# Patient Record
Sex: Male | Born: 1955 | Race: Black or African American | Hispanic: No | Marital: Married | Smoking: Never smoker
Health system: Southern US, Community
[De-identification: ages and names within clinical notes are randomized; demographics above are authoritative.]

## PROBLEM LIST (undated history)

## (undated) DIAGNOSIS — N400 Enlarged prostate without lower urinary tract symptoms: Secondary | ICD-10-CM

## (undated) DIAGNOSIS — E785 Hyperlipidemia, unspecified: Secondary | ICD-10-CM

## (undated) DIAGNOSIS — E119 Type 2 diabetes mellitus without complications: Secondary | ICD-10-CM

## (undated) DIAGNOSIS — I10 Essential (primary) hypertension: Secondary | ICD-10-CM

---

## 1998-12-12 ENCOUNTER — Emergency Department (HOSPITAL_COMMUNITY): Admission: EM | Admit: 1998-12-12 | Discharge: 1998-12-12 | Payer: Self-pay | Admitting: Emergency Medicine

## 1998-12-12 ENCOUNTER — Encounter: Payer: Self-pay | Admitting: Emergency Medicine

## 1999-04-14 ENCOUNTER — Emergency Department (HOSPITAL_COMMUNITY): Admission: EM | Admit: 1999-04-14 | Discharge: 1999-04-14 | Payer: Self-pay | Admitting: Emergency Medicine

## 1999-04-17 ENCOUNTER — Ambulatory Visit (HOSPITAL_COMMUNITY): Admission: RE | Admit: 1999-04-17 | Discharge: 1999-04-17 | Payer: Self-pay | Admitting: Cardiology

## 1999-04-17 ENCOUNTER — Encounter: Payer: Self-pay | Admitting: Cardiology

## 2000-04-17 ENCOUNTER — Emergency Department (HOSPITAL_COMMUNITY): Admission: EM | Admit: 2000-04-17 | Discharge: 2000-04-17 | Payer: Self-pay | Admitting: *Deleted

## 2000-04-27 ENCOUNTER — Emergency Department (HOSPITAL_COMMUNITY): Admission: EM | Admit: 2000-04-27 | Discharge: 2000-04-27 | Payer: Self-pay | Admitting: Emergency Medicine

## 2000-04-28 ENCOUNTER — Encounter: Admission: RE | Admit: 2000-04-28 | Discharge: 2000-04-28 | Payer: Self-pay | Admitting: Cardiovascular Disease

## 2000-04-28 ENCOUNTER — Encounter: Payer: Self-pay | Admitting: Cardiovascular Disease

## 2000-04-29 ENCOUNTER — Encounter: Payer: Self-pay | Admitting: Cardiology

## 2000-04-29 ENCOUNTER — Ambulatory Visit (HOSPITAL_COMMUNITY): Admission: RE | Admit: 2000-04-29 | Discharge: 2000-04-29 | Payer: Self-pay | Admitting: Cardiology

## 2014-11-02 ENCOUNTER — Emergency Department (HOSPITAL_COMMUNITY)
Admission: EM | Admit: 2014-11-02 | Discharge: 2014-11-03 | Disposition: A | Discharge status: Home / Self Care | Payer: 59 | Attending: Emergency Medicine | Admitting: Emergency Medicine

## 2014-11-02 ENCOUNTER — Encounter (HOSPITAL_COMMUNITY): Payer: Self-pay | Admitting: *Deleted

## 2014-11-02 DIAGNOSIS — Z79899 Other long term (current) drug therapy: Secondary | ICD-10-CM | POA: Insufficient documentation

## 2014-11-02 DIAGNOSIS — G44209 Tension-type headache, unspecified, not intractable: Secondary | ICD-10-CM

## 2014-11-02 DIAGNOSIS — E119 Type 2 diabetes mellitus without complications: Secondary | ICD-10-CM | POA: Diagnosis not present

## 2014-11-02 DIAGNOSIS — I10 Essential (primary) hypertension: Secondary | ICD-10-CM | POA: Insufficient documentation

## 2014-11-02 DIAGNOSIS — R51 Headache: Secondary | ICD-10-CM | POA: Insufficient documentation

## 2014-11-02 HISTORY — DX: Essential (primary) hypertension: I10

## 2014-11-02 HISTORY — DX: Type 2 diabetes mellitus without complications: E11.9

## 2014-11-02 LAB — CBC WITH DIFFERENTIAL/PLATELET
Basophils Absolute: 0 10*3/uL (ref 0.0–0.1)
Basophils Relative: 1 % (ref 0–1)
EOS PCT: 1 % (ref 0–5)
Eosinophils Absolute: 0.1 10*3/uL (ref 0.0–0.7)
HEMATOCRIT: 39.4 % (ref 39.0–52.0)
Hemoglobin: 13.3 g/dL (ref 13.0–17.0)
Lymphocytes Relative: 42 % (ref 12–46)
Lymphs Abs: 2.4 10*3/uL (ref 0.7–4.0)
MCH: 30 pg (ref 26.0–34.0)
MCHC: 33.8 g/dL (ref 30.0–36.0)
MCV: 88.7 fL (ref 78.0–100.0)
MONO ABS: 0.5 10*3/uL (ref 0.1–1.0)
Monocytes Relative: 8 % (ref 3–12)
Neutro Abs: 2.7 10*3/uL (ref 1.7–7.7)
Neutrophils Relative %: 48 % (ref 43–77)
PLATELETS: 244 10*3/uL (ref 150–400)
RBC: 4.44 MIL/uL (ref 4.22–5.81)
RDW: 13.7 % (ref 11.5–15.5)
WBC: 5.7 10*3/uL (ref 4.0–10.5)

## 2014-11-02 LAB — COMPREHENSIVE METABOLIC PANEL
ALT: 12 U/L (ref 0–53)
AST: 16 U/L (ref 0–37)
Albumin: 3.7 g/dL (ref 3.5–5.2)
Alkaline Phosphatase: 78 U/L (ref 39–117)
Anion gap: 10 (ref 5–15)
BILIRUBIN TOTAL: 0.2 mg/dL — AB (ref 0.3–1.2)
BUN: 14 mg/dL (ref 6–23)
CHLORIDE: 102 mmol/L (ref 96–112)
CO2: 27 mmol/L (ref 19–32)
CREATININE: 1.32 mg/dL (ref 0.50–1.35)
Calcium: 9.7 mg/dL (ref 8.4–10.5)
GFR calc Af Amer: 66 mL/min — ABNORMAL LOW (ref >90)
GFR calc non Af Amer: 57 mL/min — ABNORMAL LOW (ref >90)
Glucose, Bld: 148 mg/dL — ABNORMAL HIGH (ref 70–99)
Potassium: 3.4 mmol/L — ABNORMAL LOW (ref 3.5–5.1)
Sodium: 139 mmol/L (ref 135–145)
Total Protein: 6.7 g/dL (ref 6.0–8.3)

## 2014-11-02 LAB — CBG MONITORING, ED: GLUCOSE-CAPILLARY: 219 mg/dL — AB (ref 70–99)

## 2014-11-02 MED ORDER — SODIUM CHLORIDE 0.9 % IV SOLN
1000.0000 mL | Freq: Once | INTRAVENOUS | Status: AC
  Administered 2014-11-02: 1000 mL via INTRAVENOUS

## 2014-11-02 MED ORDER — METOCLOPRAMIDE HCL 5 MG/ML IJ SOLN
10.0000 mg | Freq: Once | INTRAMUSCULAR | Status: AC
  Administered 2014-11-02: 10 mg via INTRAVENOUS
  Filled 2014-11-02: qty 2

## 2014-11-02 MED ORDER — POTASSIUM CHLORIDE CRYS ER 20 MEQ PO TBCR
40.0000 meq | EXTENDED_RELEASE_TABLET | Freq: Once | ORAL | Status: AC
  Administered 2014-11-02: 40 meq via ORAL
  Filled 2014-11-02: qty 2

## 2014-11-02 MED ORDER — DIPHENHYDRAMINE HCL 50 MG/ML IJ SOLN
25.0000 mg | Freq: Once | INTRAMUSCULAR | Status: AC
  Administered 2014-11-02: 25 mg via INTRAVENOUS
  Filled 2014-11-02: qty 1

## 2014-11-02 MED ORDER — SODIUM CHLORIDE 0.9 % IV SOLN: 1000.0000 mL | INTRAVENOUS | Status: DC

## 2014-11-02 NOTE — ED Notes (Signed)
Pts CBG 216. Pts wife approached nurse first requesting a wheelchair [place] [facility] pt was feeling weak. Pt brought back to triage CBG done and vitals rechecked. Nurse first made aware.

## 2014-11-02 NOTE — ED Provider Notes (Signed)
CSN: [id number]     Arrival date & time 11/02/14  1856 History   First MD Initiated Contact with Patient 11/02/14 2150     Chief Complaint  Patient presents with  . Hypertension     (Consider location/radiation/quality/duration/timing/severity/associated sxs/prior Treatment) Patient is a 59 y.o. male presenting with hypertension. The history is provided by the patient.  Hypertension  He developed a bifrontal headache this afternoon. Headache was throbbing in nature and he rated it at 9/10. There is no visual change or photophobia, but there was nausea. Headache was worse when he was exposed to loud noise. Because of the headache, he checked his blood pressure at home and was 210/110 taking to the ED. He actually did check his blood pressure twice before coming. He is prescribed medication for his blood pressure but admits that he does miss doses on occasion. He does not check his blood pressure on a regular basis. He denies epistaxis or tinnitus.  Past Medical History  Diagnosis Date  . Hypertension   . Diabetes mellitus without complication    History reviewed. No pertinent past surgical history. No family history on file. History  Substance Use Topics  . Smoking status: Never Smoker   . Smokeless tobacco: Not on file  . Alcohol Use: No    Review of Systems  All other systems reviewed and are negative.     Allergies  Review of patient's allergies indicates no known allergies.  Home Medications   Prior to Admission medications   Medication Sig Start Date End Date Taking? Authorizing Provider  Cholecalciferol (VITAMIN D PO) Take 1 tablet by mouth 3 (three) times daily.   Yes Historical Provider, MD  metFORMIN (GLUCOPHAGE) 1000 MG tablet Take 1,000 mg by mouth daily.   Yes Historical Provider, MD  Multiple Vitamins-Minerals (ZINC PO) Take 1 tablet by mouth 3 (three) times daily.   Yes Historical Provider, MD  olmesartan (BENICAR) 40 MG tablet Take 40 mg by mouth daily.   Yes  Historical Provider, MD  rosuvastatin (CRESTOR) 10 MG tablet Take 10 mg by mouth daily.   Yes Historical Provider, MD   BP 182/90 mmHg  Pulse 60  Temp(Src) 98.3 F (36.8 C)  Resp 18  Wt 187 lb 7 oz (85.021 kg)  SpO2 98% Physical Exam  Nursing note and vitals reviewed.  59 year old male, resting comfortably and in no acute distress. Vital signs are significant for hypertension. Oxygen saturation is 98%, which is normal. Head is normocephalic and atraumatic. PERRLA, EOMI. Oropharynx is clear. Fundi show no hemorrhage, exudate, or papilledema. There is tenderness to palpation over the temporalis muscles bilaterally. There is no tenderness over the temporal arteries. Neck is nontender and supple without adenopathy or JVD. Back is nontender and there is no CVA tenderness. Lungs are clear without rales, wheezes, or rhonchi. Chest is nontender. Heart has regular rate and rhythm without murmur. Abdomen is soft, flat, nontender without masses or hepatosplenomegaly and peristalsis is normoactive. Extremities have no cyanosis or edema, full range of motion is present. Skin is warm and dry without rash. Neurologic: Mental status is normal, cranial nerves are intact, there are no motor or sensory deficits.  ED Course  Procedures (including critical care time) Labs Review Results for orders placed or performed during the hospital encounter of 11/02/14  CBC with Differential  Result Value Ref Range   WBC 5.7 4.0 - 10.5 K/uL   RBC 4.44 4.22 - 5.81 MIL/uL   Hemoglobin 13.3 13.0 - 17.0 g/dL  HCT 39.4 39.0 - 52.0 %   MCV 88.7 78.0 - 100.0 fL   MCH 30.0 26.0 - 34.0 pg   MCHC 33.8 30.0 - 36.0 g/dL   RDW 13.7 11.5 - 15.5 %   Platelets 244 150 - 400 K/uL   Neutrophils Relative % 48 43 - 77 %   Neutro Abs 2.7 1.7 - 7.7 K/uL   Lymphocytes Relative 42 12 - 46 %   Lymphs Abs 2.4 0.7 - 4.0 K/uL   Monocytes Relative 8 3 - 12 %   Monocytes Absolute 0.5 0.1 - 1.0 K/uL   Eosinophils Relative 1 0 - 5 %    Eosinophils Absolute 0.1 0.0 - 0.7 K/uL   Basophils Relative 1 0 - 1 %   Basophils Absolute 0.0 0.0 - 0.1 K/uL  Comprehensive metabolic panel  Result Value Ref Range   Sodium 139 135 - 145 mmol/L   Potassium 3.4 (L) 3.5 - 5.1 mmol/L   Chloride 102 96 - 112 mmol/L   CO2 27 19 - 32 mmol/L   Glucose, Bld 148 (H) 70 - 99 mg/dL   BUN 14 6 - 23 mg/dL   Creatinine, Ser 1.32 0.50 - 1.35 mg/dL   Calcium 9.7 8.4 - 10.5 mg/dL   Total Protein 6.7 6.0 - 8.3 g/dL   Albumin 3.7 3.5 - 5.2 g/dL   AST 16 0 - 37 U/L   ALT 12 0 - 53 U/L   Alkaline Phosphatase 78 39 - 117 U/L   Total Bilirubin 0.2 (L) 0.3 - 1.2 mg/dL   GFR calc non Af Amer 57 (L) >90 mL/min   GFR calc Af Amer 66 (L) >90 mL/min   Anion gap 10 5 - 15  POC CBG, ED  Result Value Ref Range   Glucose-Capillary 219 (H) 70 - 99 mg/dL     MDM   Final diagnoses:  Muscle contraction headache  Essential hypertension    Headache with the characteristics suggestive of muscle contraction headache. Hypertension which is inadequately controlled. He will be given treatment for his headache with IV fluids, metoclopramide, and diphenhydramine and blood pressure rechecked once headache has resolved. Prior records are reviewed and he has no recent visits in the [facility] system.  He had excellent relief of his headache with above noted treatment. With this, blood pressure has come down to 156/73. He is discharged with instructions to monitor his blood pressure at home and to keep a log to take with him when he sees his PCP. No indication for adjusting his antihypertensive regimen today.  Delora Fuel, MD [id number]

## 2014-11-02 NOTE — ED Notes (Signed)
The pt is c/o high bp today and he has had aheadache for several days.  He takes metformin for diabetes and he has been loosing weight since he started taking it

## 2014-11-02 NOTE — ED Notes (Signed)
Ambulatory to restroom

## 2014-11-02 NOTE — ED Notes (Signed)
Pt reports he is hungry and [place] [employer] [place]. Pt observed [place] [facility] with steady gait, NAD. Pt reports he will be back to waiting room to be seen by MD when his name is called.

## 2014-11-03 NOTE — Discharge Instructions (Signed)
Monitor your blood pressure at home and keep a log of it. The spring this log with you when you see your primary care provider.  General Headache Without Cause A headache is pain or discomfort felt around the head or neck area. The specific cause of a headache may not be found. There are many causes and types of headaches. A few common ones are:  Tension headaches.  Migraine headaches.  Cluster headaches.  Chronic daily headaches. HOME CARE INSTRUCTIONS   Keep all follow-up appointments with your caregiver or any specialist referral.  Only take over-the-counter or prescription medicines for pain or discomfort as directed by your caregiver.  Lie down in a dark, quiet room when you have a headache.  Keep a headache journal to find out what may trigger your migraine headaches. For example, write down:  What you eat and drink.  How much sleep you get.  Any change to your diet or medicines.  Try massage or other relaxation techniques.  Put ice packs or heat on the head and neck. Use these 3 to 4 times per day for 15 to 20 minutes each time, or as needed.  Limit stress.  Sit up straight, and do not tense your muscles.  Quit smoking if you smoke.  Limit alcohol use.  Decrease the amount of caffeine you drink, or stop drinking caffeine.  Eat and sleep on a regular schedule.  Get 7 to 9 hours of sleep, or as recommended by your caregiver.  Keep lights dim if bright lights bother you and make your headaches worse. SEEK MEDICAL CARE IF:   You have problems with the medicines you were prescribed.  Your medicines are not working.  You have a change from the usual headache.  You have nausea or vomiting. SEEK IMMEDIATE MEDICAL CARE IF:   Your headache becomes severe.  You have a fever.  You have a stiff neck.  You have loss of vision.  You have muscular weakness or loss of muscle control.  You start losing your balance or have trouble walking.  You feel faint or  pass out.  You have severe symptoms that are different from your first symptoms. MAKE SURE YOU:   Understand these instructions.  Will watch your condition.  Will get help right away if you are not doing well or get worse. Document Released: 07/20/2005 Document Revised: 10/12/2011 Document Reviewed: 08/05/2011 [facility] Patient Information 2015 [place], [place]. This information is not intended to replace advice given to you by your health care provider. Make sure you discuss any questions you have with your health care provider.

## 2014-11-06 ENCOUNTER — Encounter (HOSPITAL_COMMUNITY): Payer: Self-pay | Admitting: Emergency Medicine

## 2014-11-06 ENCOUNTER — Emergency Department (HOSPITAL_COMMUNITY)
Admission: EM | Admit: 2014-11-06 | Discharge: 2014-11-06 | Discharge status: Against Medical Advice | Payer: 59 | Attending: Emergency Medicine | Admitting: Emergency Medicine

## 2014-11-06 DIAGNOSIS — E119 Type 2 diabetes mellitus without complications: Secondary | ICD-10-CM | POA: Diagnosis not present

## 2014-11-06 DIAGNOSIS — I10 Essential (primary) hypertension: Secondary | ICD-10-CM | POA: Insufficient documentation

## 2014-11-06 NOTE — ED Notes (Signed)
The patient has been having Hypertension since friday. HIs wife has been keeping track of the pressure for the last couple of days.  He did say he had a slight headache but it resolved.  The patient was seen here last Friday and was given IV meds and he was sent home.  He is back for the same thing.

## 2014-11-06 NOTE — ED Notes (Signed)
Patient advised registration he was LWBS at 0310hrs

## 2016-03-21 ENCOUNTER — Emergency Department (HOSPITAL_COMMUNITY): Payer: Self-pay

## 2016-03-21 ENCOUNTER — Observation Stay (HOSPITAL_COMMUNITY)
Admission: EM | Admit: 2016-03-21 | Discharge: 2016-03-24 | Disposition: A | Discharge status: Home / Self Care | Payer: Self-pay | Attending: Internal Medicine | Admitting: Internal Medicine

## 2016-03-21 ENCOUNTER — Encounter (HOSPITAL_COMMUNITY): Payer: Self-pay | Admitting: Emergency Medicine

## 2016-03-21 DIAGNOSIS — I2510 Atherosclerotic heart disease of native coronary artery without angina pectoris: Secondary | ICD-10-CM | POA: Insufficient documentation

## 2016-03-21 DIAGNOSIS — I10 Essential (primary) hypertension: Secondary | ICD-10-CM | POA: Diagnosis present

## 2016-03-21 DIAGNOSIS — N400 Enlarged prostate without lower urinary tract symptoms: Secondary | ICD-10-CM | POA: Diagnosis present

## 2016-03-21 DIAGNOSIS — R0789 Other chest pain: Principal | ICD-10-CM | POA: Insufficient documentation

## 2016-03-21 DIAGNOSIS — I129 Hypertensive chronic kidney disease with stage 1 through stage 4 chronic kidney disease, or unspecified chronic kidney disease: Secondary | ICD-10-CM | POA: Insufficient documentation

## 2016-03-21 DIAGNOSIS — E8881 Metabolic syndrome: Secondary | ICD-10-CM | POA: Insufficient documentation

## 2016-03-21 DIAGNOSIS — N179 Acute kidney failure, unspecified: Secondary | ICD-10-CM | POA: Diagnosis present

## 2016-03-21 DIAGNOSIS — E781 Pure hyperglyceridemia: Secondary | ICD-10-CM | POA: Insufficient documentation

## 2016-03-21 DIAGNOSIS — Z566 Other physical and mental strain related to work: Secondary | ICD-10-CM

## 2016-03-21 DIAGNOSIS — Z7982 Long term (current) use of aspirin: Secondary | ICD-10-CM | POA: Insufficient documentation

## 2016-03-21 DIAGNOSIS — R079 Chest pain, unspecified: Secondary | ICD-10-CM | POA: Diagnosis present

## 2016-03-21 DIAGNOSIS — Z7984 Long term (current) use of oral hypoglycemic drugs: Secondary | ICD-10-CM | POA: Insufficient documentation

## 2016-03-21 DIAGNOSIS — E1165 Type 2 diabetes mellitus with hyperglycemia: Secondary | ICD-10-CM | POA: Insufficient documentation

## 2016-03-21 DIAGNOSIS — E1129 Type 2 diabetes mellitus with other diabetic kidney complication: Secondary | ICD-10-CM

## 2016-03-21 DIAGNOSIS — N183 Chronic kidney disease, stage 3 unspecified: Secondary | ICD-10-CM | POA: Diagnosis present

## 2016-03-21 DIAGNOSIS — R9439 Abnormal result of other cardiovascular function study: Secondary | ICD-10-CM

## 2016-03-21 DIAGNOSIS — E1122 Type 2 diabetes mellitus with diabetic chronic kidney disease: Secondary | ICD-10-CM | POA: Insufficient documentation

## 2016-03-21 DIAGNOSIS — E1100 Type 2 diabetes mellitus with hyperosmolarity without nonketotic hyperglycemic-hyperosmolar coma (NKHHC): Secondary | ICD-10-CM

## 2016-03-21 DIAGNOSIS — E785 Hyperlipidemia, unspecified: Secondary | ICD-10-CM | POA: Diagnosis present

## 2016-03-21 HISTORY — DX: Hyperlipidemia, unspecified: E78.5

## 2016-03-21 HISTORY — DX: Benign prostatic hyperplasia without lower urinary tract symptoms: N40.0

## 2016-03-21 LAB — BASIC METABOLIC PANEL
Anion gap: 8 (ref 5–15)
BUN: 20 mg/dL (ref 6–20)
CHLORIDE: 98 mmol/L — AB (ref 101–111)
CO2: 26 mmol/L (ref 22–32)
CREATININE: 2.08 mg/dL — AB (ref 0.61–1.24)
Calcium: 9.3 mg/dL (ref 8.9–10.3)
GFR, EST AFRICAN AMERICAN: 38 mL/min — AB (ref >60)
GFR, EST NON AFRICAN AMERICAN: 33 mL/min — AB (ref >60)
Glucose, Bld: 363 mg/dL — ABNORMAL HIGH (ref 65–99)
Potassium: 3.6 mmol/L (ref 3.5–5.1)
SODIUM: 132 mmol/L — AB (ref 135–145)

## 2016-03-21 LAB — CBC
HCT: 40.3 % (ref 39.0–52.0)
Hemoglobin: 13.7 g/dL (ref 13.0–17.0)
MCH: 31.2 pg (ref 26.0–34.0)
MCHC: 34 g/dL (ref 30.0–36.0)
MCV: 91.8 fL (ref 78.0–100.0)
PLATELETS: 268 10*3/uL (ref 150–400)
RBC: 4.39 MIL/uL (ref 4.22–5.81)
RDW: 12.3 % (ref 11.5–15.5)
WBC: 5.9 10*3/uL (ref 4.0–10.5)

## 2016-03-21 LAB — I-STAT TROPONIN, ED: Troponin i, poc: 0.01 ng/mL (ref 0.00–0.08)

## 2016-03-21 LAB — URINE MICROSCOPIC-ADD ON
RBC / HPF: NONE SEEN RBC/hpf (ref 0–5)
WBC UA: NONE SEEN WBC/hpf (ref 0–5)

## 2016-03-21 LAB — URINALYSIS, ROUTINE W REFLEX MICROSCOPIC
BILIRUBIN URINE: NEGATIVE
Glucose, UA: >1000 mg/dL — AB
Hgb urine dipstick: NEGATIVE
Ketones, ur: NEGATIVE mg/dL
Leukocytes, UA: NEGATIVE
Nitrite: NEGATIVE
PH: 5.5 (ref 5.0–8.0)
Protein, ur: NEGATIVE mg/dL
Specific Gravity, Urine: 1.026 (ref 1.005–1.030)

## 2016-03-21 LAB — TROPONIN I: Troponin I: <0.03 ng/mL (ref <0.03)

## 2016-03-21 LAB — D-DIMER, QUANTITATIVE (NOT AT ARMC): D-Dimer, Quant: 0.34 ug/mL-FEU (ref 0.00–0.50)

## 2016-03-21 LAB — TSH: TSH: 2.07 u[IU]/mL (ref 0.350–4.500)

## 2016-03-21 MED ORDER — SODIUM CHLORIDE 0.9 % IV BOLUS (SEPSIS)
1000.0000 mL | Freq: Once | INTRAVENOUS | Status: AC
  Administered 2016-03-21: 1000 mL via INTRAVENOUS

## 2016-03-21 MED ORDER — NITROGLYCERIN 0.4 MG SL SUBL
0.4000 mg | SUBLINGUAL_TABLET | SUBLINGUAL | Status: DC | PRN
  Administered 2016-03-21 (×2): 0.4 mg via SUBLINGUAL
  Filled 2016-03-21: qty 1

## 2016-03-21 MED ORDER — ASPIRIN 81 MG PO CHEW
324.0000 mg | CHEWABLE_TABLET | Freq: Once | ORAL | Status: AC
  Administered 2016-03-21: 324 mg via ORAL
  Filled 2016-03-21: qty 4

## 2016-03-21 NOTE — ED Provider Notes (Signed)
[facility] DEPT Provider Note   CSN: [id number] Arrival date & time: 03/21/16  1650     History   Chief Complaint Chief Complaint  Patient presents with  . Dizziness  . Chest Pain    HPI Manuel [name] is a 60 y.o. male.  HPI  Patient with HTN, HLD, DM presents with chest pain.  He mentioned fatigue for a few days.  This morning he woke up with 10/10 crushing chest pain, diaphoresis, SOB.  It has been off/on throughout the day.  The pain is still present.  No recent fevers / chills / other illness.  Past Medical History:  Diagnosis Date  . Diabetes mellitus without complication ([place])   . Hypertension     There are no active problems to display for this patient.   History reviewed. No pertinent surgical history.     Home Medications    Prior to Admission medications   Medication Sig Start Date End Date Taking? Authorizing Provider  Azilsartan-Chlorthalidone (EDARBYCLOR) 40-12.5 MG TABS Take 1 tablet by mouth daily.   Yes Historical Provider, MD  metFORMIN (GLUCOPHAGE) 1000 MG tablet Take 500-1,000 mg by mouth daily.    Yes Historical Provider, MD    Family History No family history on file.  Social History Social History  Substance Use Topics  . Smoking status: Never Smoker  . Smokeless tobacco: Never Used  . Alcohol use No     Allergies   Review of patient's allergies indicates no known allergies.   Review of Systems Review of Systems  Constitutional: Positive for diaphoresis. Negative for chills and fever.  HENT: Negative for ear pain and sore throat.   Eyes: Negative for pain and visual disturbance.  Respiratory: Positive for shortness of breath. Negative for cough.   Cardiovascular: Positive for chest pain. Negative for palpitations.  Gastrointestinal: Negative for abdominal pain and vomiting.  Genitourinary: Negative for dysuria and hematuria.  Musculoskeletal: Negative for arthralgias and back pain.  Skin: Negative for color change and  rash.  Neurological: Negative for seizures and syncope.  All other systems reviewed and are negative.    Physical Exam Updated Vital Signs BP 121/70   Pulse 62   Temp 98.4 F (36.9 C) (Oral)   Resp 19   Ht 5\' 7"  (1.702 m)   Wt 84.8 kg   SpO2 99%   BMI 29.29 kg/m   Physical Exam  Constitutional: He appears well-developed and well-nourished.  HENT:  Head: Normocephalic and atraumatic.  Eyes: Conjunctivae are normal.  Neck: Neck supple.  Cardiovascular: Normal rate and regular rhythm.   No murmur heard. Pulmonary/Chest: Effort normal and breath sounds normal. No respiratory distress.  Abdominal: Soft. There is no tenderness.  Musculoskeletal: He exhibits no edema.  Neurological: He is alert.  Skin: Skin is warm. He is diaphoretic.  Psychiatric: He has a normal mood and affect.  Nursing note and vitals reviewed.    ED Treatments / Results  Labs (all labs ordered are listed, but only abnormal results are displayed) Labs Reviewed  BASIC METABOLIC PANEL - Abnormal; Notable for the following:       Result Value   Sodium 132 (*)    Chloride 98 (*)    Glucose, Bld 363 (*)    Creatinine, Ser 2.08 (*)    GFR calc non Af Amer 33 (*)    GFR calc Af Amer 38 (*)    All other components within normal limits  URINE CULTURE  CBC  TSH  D-DIMER, QUANTITATIVE (  NOT AT [facility])  TROPONIN I  URINALYSIS, ROUTINE W REFLEX MICROSCOPIC (NOT AT [facility])  I-STAT TROPOININ, ED    EKG  EKG Interpretation  Date/Time:  Saturday March 21 2016 16:57:16 EDT Ventricular Rate:  80 PR Interval:  204 QRS Duration: 102 QT Interval:  394 QTC Calculation: [id number] R Axis:   -20 Text Interpretation:  Normal sinus rhythm Nonspecific T wave abnormality Abnormal ECG Abnormal ekg Confirmed by Carmin Muskrat  MD [phone]) on 03/21/2016 9:55:25 PM       Radiology Dg Chest 2 View  Result Date: 03/21/2016 CLINICAL DATA:  Center chest pain, SOB and fatigue x2 days. Hx of HTN, DM, Nonsmoker. EXAM: CHEST  2  VIEW COMPARISON:  None. FINDINGS: Midline trachea. Normal heart size and mediastinal contours. No pleural effusion or pneumothorax. Lower lobe predominant pulmonary interstitial prominence. No lobar consolidation. IMPRESSION: Lower lobe predominant pulmonary interstitial prominence. This is of indeterminate acuity and significance. Although this could relate to chronic bronchitis, in this nonsmoker, viral or atypical bacterial infection cannot be excluded. Comparison with prior radiographs would be useful if available. Electronically Signed   By: Abigail Miyamoto M.D.   On: 03/21/2016 18:06    Procedures Procedures (including critical care time)  Medications Ordered in ED Medications  aspirin chewable tablet 324 mg (not administered)  nitroGLYCERIN (NITROSTAT) SL tablet 0.4 mg (not administered)  sodium chloride 0.9 % bolus 1,000 mL (not administered)     Initial Impression / Assessment and Plan / ED Course  I have reviewed the triage vital signs and the nursing notes.  Pertinent labs & imaging results that were available during my care of the patient were reviewed by me and considered in my medical decision making (see chart for details).  Clinical Course     Patient presents for evaluation of chest pain.  ASA given.  HEART Pathway score is 5.  Troponin negative x1.  EKG is NSR with inverted T waves in inferior leads (no priors to compare to).  Feel like PNA unlikely given CXR without infiltrates or effusion, no leukocytosis, no fever.  No mediastinal widening on CXR.  Doubt emergent intraabdominal pathology given benign abdominal exam and no abdominal symptoms.  Low risk [place] and d-dimer negative.   Labs also reveal increased Cr, possibly intrinsic vs prerenal.  Fluids given.   Patient admitted to hospitalist for ACS ruleout.    Final Clinical Impressions(s) / ED Diagnoses   Final diagnoses:  None    New Prescriptions New Prescriptions   No medications on file  =   Levada Schilling, MD 03/21/16 [id number]    Carmin Muskrat, MD 03/22/16 [phone]

## 2016-03-21 NOTE — ED Notes (Signed)
Pt given [place] sandwich and water, ok per dr Vanita Panda.

## 2016-03-21 NOTE — ED Notes (Signed)
MD at bedside. 

## 2016-03-21 NOTE — H&P (Signed)
History and Physical    Manuel [name] [id number] DOB: 14-Jun-1956 DOA: 03/21/2016  PCP: Maximino Greenland, MD and Marye Round, NP Consultants:  None Patient coming from: home - lives with wife  Chief Complaint: chest pain  HPI: Manuel [name] is a 60 y.o. male with medical history significant of DM, HTN, HLD, and BPH who reports about 0200 he woke up sweating with substernal chest pressure.  Started seeing black and white dots.  Started breathing heavily.  Then felt extreme exhaustion.  Would take 2-3 steps and then felt too tired to go on.  Symptoms lasted off and on all day.  Took Excedrin with a tad bit of relief but chest pressure still present.  Waited until 1600 to come into ER because he was hoping the meds would give relief but he finally accepted that they wouldn't.  Non-exertional.  Not reproducible.  Has not yet received NTG and so doesn't know if this will help.  Pressure is much better than it was earlier, has relieved spotaneously.  Currently 3/10, down from 7/10.      Last stress test (GXT) was many years ago.  No h/o heart cath.  Reports significant work stress.  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   ED Course: Per Dr. Augustin Coupe: Patient presents for evaluation of chest pain.  ASA given.  HEART Pathway score is 5.  Troponin negative x1.  EKG is NSR with inverted T waves in inferior leads (no priors to compare to).  Feel like PNA unlikely given CXR without infiltrates or effusion, no leukocytosis, no fever.  No mediastinal widening on CXR.  Doubt emergent intraabdominal pathology given benign abdominal exam and no abdominal symptoms.  Low risk [place] and d-dimer negative.   Labs also reveal increased Cr, possibly intrinsic vs prerenal.  Fluids given.   Patient admitted to hospitalist for ACS ruleout.  Review of Systems: As per HPI; otherwise 10 point review of systems reviewed and negative.   Ambulatory  Status:  Ambulates independently  Past Medical History:  Diagnosis Date  . BPH (benign prostatic hyperplasia)   . Diabetes mellitus without complication ([place])   . Hyperlipidemia   . Hypertension     History reviewed. No pertinent surgical history.  Social History   Social History  . Marital status: Married    Spouse name: N/A  . Number of children: N/A  . Years of education: N/A   Occupational History  . psychotherapist    Social History Main Topics  . Smoking status: Never Smoker  . Smokeless tobacco: Never Used  . Alcohol use No  . Drug use: No  . Sexual activity: Not on file   Other Topics Concern  . Not on file   Social History Narrative  . No narrative on file    No Known Allergies  Family History  Problem Relation Age of Onset  . Scleroderma Mother 26  . Congestive Heart Failure Father 72    Prior to Admission medications   Medication Sig Start Date End Date Taking? Authorizing Provider  Azilsartan-Chlorthalidone (EDARBYCLOR) 40-12.5 MG TABS Take 1 tablet by mouth daily.   Yes Historical Provider, MD  metFORMIN (GLUCOPHAGE) 1000 MG tablet Take 500-1,000 mg by mouth daily.    Yes Historical Provider, MD    Physical Exam: Vitals:   03/21/16 2145 03/21/16 2315 03/21/16 2330 03/21/16 2345  BP: 133/83 122/69 119/70 109/73  Pulse: 111 (!) 53 66 61  Resp: 17  14 17 23   Temp:      TempSrc:      SpO2: 96% 98% 96% 98%  Weight:      Height:         General: Appears calm and comfortable and is NAD Eyes:  PERRL, EOMI, normal lids, iris ENT:  grossly normal hearing, lips & tongue, mmm Neck:  no LAD, masses or thyromegaly Cardiovascular:  RRR, no m/r/g. No LE edema.  Respiratory:  CTA bilaterally, no w/r/r. Normal respiratory effort. Abdomen:  soft, ntnd, NABS Skin:  no rash or induration seen on limited exam Musculoskeletal:  grossly normal tone BUE/BLE, good ROM, no bony abnormality Psychiatric:  Somewhat anxious affect, speech fluent and appropriate,  AOx3 Neurologic:  CN 2-12 grossly intact, moves all extremities in coordinated fashion, sensation intact  Labs on Admission: I have personally reviewed following labs and imaging studies  CBC:  Recent Labs Lab 03/21/16 1656  WBC 5.9  HGB 13.7  HCT 40.3  MCV 91.8  PLT [id number]   Basic Metabolic Panel:  Recent Labs Lab 03/21/16 1656  NA 132*  K 3.6  CL 98*  CO2 26  GLUCOSE 363*  BUN 20  CREATININE 2.08*  CALCIUM 9.3   GFR: Estimated Creatinine Clearance: 38.8 mL/min (by C-G formula based on SCr of 2.08 mg/dL). Liver Function Tests: No results for input(s): AST, ALT, ALKPHOS, BILITOT, PROT, ALBUMIN in the last 168 hours. No results for input(s): LIPASE, AMYLASE in the last 168 hours. No results for input(s): AMMONIA in the last 168 hours. Coagulation Profile: No results for input(s): INR, PROTIME in the last 168 hours. Cardiac Enzymes:  Recent Labs Lab 03/21/16 2150  TROPONINI <0.03   BNP (last 3 results) No results for input(s): PROBNP in the last 8760 hours. HbA1C: No results for input(s): HGBA1C in the last 72 hours. CBG: No results for input(s): GLUCAP in the last 168 hours. Lipid Profile: No results for input(s): CHOL, HDL, LDLCALC, TRIG, CHOLHDL, LDLDIRECT in the last 72 hours. Thyroid Function Tests:  Recent Labs  03/21/16 2150  TSH 2.070   Anemia Panel: No results for input(s): VITAMINB12, FOLATE, FERRITIN, TIBC, IRON, RETICCTPCT in the last 72 hours. Urine analysis:    Component Value Date/Time   COLORURINE YELLOW 03/21/2016 [place] 03/21/2016 2225   LABSPEC 1.026 03/21/2016 2225   PHURINE 5.5 03/21/2016 2225   GLUCOSEU >1000 (A) 03/21/2016 2225   HGBUR NEGATIVE 03/21/2016 2225   BILIRUBINUR NEGATIVE 03/21/2016 2225   KETONESUR NEGATIVE 03/21/2016 2225   PROTEINUR NEGATIVE 03/21/2016 2225   NITRITE NEGATIVE 03/21/2016 2225   LEUKOCYTESUR NEGATIVE 03/21/2016 2225    Creatinine Clearance: Estimated Creatinine Clearance:  38.8 mL/min (by C-G formula based on SCr of 2.08 mg/dL).  Sepsis Labs: @LABRCNTIP (procalcitonin:4,lacticidven:4) )No results found for this or any previous visit (from the past 240 hour(s)).   Radiological Exams on Admission: Dg Chest 2 View  Result Date: 03/21/2016 CLINICAL DATA:  Center chest pain, SOB and fatigue x2 days. Hx of HTN, DM, Nonsmoker. EXAM: CHEST  2 VIEW COMPARISON:  None. FINDINGS: Midline trachea. Normal heart size and mediastinal contours. No pleural effusion or pneumothorax. Lower lobe predominant pulmonary interstitial prominence. No lobar consolidation. IMPRESSION: Lower lobe predominant pulmonary interstitial prominence. This is of indeterminate acuity and significance. Although this could relate to chronic bronchitis, in this nonsmoker, viral or atypical bacterial infection cannot be excluded. Comparison with prior radiographs would be useful if available. Electronically Signed   By: Adria Devon.D.  On: 03/21/2016 18:06    EKG: Independently reviewed.  NSR with rate 80; nonspecific ST changes with no evidence of acute ischemia  Assessment/Plan Principal Problem:   Chest pain Active Problems:   Diabetes mellitus, type 2 (HCC)   Essential hypertension   Hyperlipidemia   BPH (benign prostatic hyperplasia)   Stress at work   Acute kidney injury superimposed on chronic kidney disease ([place])   Chest pain -Patient with substernal chest pain that came on at rest and has been episodically present all day but improves spontaneously.   -1/3 typical symptoms suggestive of atypical chest pain.  -CXR unremarkable.   -Initial cardiac enzymes negative.   -EKG not indicative of acute ischemia.   -TIMI risk score is 2; which predicts a 14 days risk of death, recurrent MI, or urgent revascularization of 8.3%.  -Will plan to place in observation status on telemetry to rule out ACS by overnight observation.  -cycle cardiac enzymes q6h x 3 and repeat EKG in AM -ASA 325 mg PO  daily -morphine, statin given -Risk factor stratification with FLP and HgbA1c; TSH checked and normal; will check UDS -MPS in AM -Cardiology consultation in AM - unassigned, notified via Inbox message to the [place] for discharge to home tomorrow after stress testing if tests are negative for ischemia  AKI on CKD, Stage 2 -Baseline creatinine in 4/16 was 1.32 -While it is possible that his creatinine has simply increased with worsening of CKD resulting from chronic medical problems, will currently treat as AKI superimposed  -He is on Metformin and this should be held for this creatinine regardless -Will rehydrate with 125 cc/hr LR overnight and recheck in AM  DM -Reports last A1c about 3 months ago of 6.7 and lifetime high of 7 -Will check A1c -Current glucose is markedly elevated at 363 -Will cover with SSI while holding Metformin, as above -If creatinine does not improve, would suggest an alternative medication for treatment of his diabetes at the time of discharge  HTN -Good control in ER -Take combo pill at home and neither medication (ARB, HCTZ) is a good choice right now based on AKI -Will hold PO medication for now and cover with prn Hydralazine  HLD -Not on statin therapy at home -Will check FLP -will start Lipitor 20 mg with first dose now for now and determine appropriate dose after lipid profile and stress test result  BPH -Reports taking Flomax -It is not on his current medication list -Can resume at discharge  Stress at work -Patient reports significant work stress and believes this is the cause of his current chest pain -Will r/o for ACS and then reassess -Patient encouraged to talk with his supervisors if he is truly that uncomfortable about his job; he plans to do so on Monday   DVT prophylaxis: Lovenox  Code Status:  Full - confirmed with patient/family Family Communication: Wife present throughout evaluation  Disposition Plan:  Home once  clinically improved Consults called: Cardiology, unassigned, via [employer] inbox message  Admission status: Observation, telemetry    Karmen Bongo MD [facility]  If 7PM-7AM, please contact night-coverage [email or web address] Password [facility]  03/21/2016, 11:57 PM

## 2016-03-21 NOTE — ED Triage Notes (Signed)
Pt c/o dizziness and chest pain x 2 days.

## 2016-03-22 ENCOUNTER — Observation Stay (HOSPITAL_COMMUNITY): Payer: Self-pay

## 2016-03-22 ENCOUNTER — Observation Stay (HOSPITAL_BASED_OUTPATIENT_CLINIC_OR_DEPARTMENT_OTHER): Payer: Self-pay

## 2016-03-22 DIAGNOSIS — N400 Enlarged prostate without lower urinary tract symptoms: Secondary | ICD-10-CM

## 2016-03-22 DIAGNOSIS — E119 Type 2 diabetes mellitus without complications: Secondary | ICD-10-CM

## 2016-03-22 DIAGNOSIS — E1100 Type 2 diabetes mellitus with hyperosmolarity without nonketotic hyperglycemic-hyperosmolar coma (NKHHC): Secondary | ICD-10-CM

## 2016-03-22 DIAGNOSIS — R079 Chest pain, unspecified: Secondary | ICD-10-CM

## 2016-03-22 DIAGNOSIS — R0789 Other chest pain: Secondary | ICD-10-CM

## 2016-03-22 DIAGNOSIS — N189 Chronic kidney disease, unspecified: Secondary | ICD-10-CM

## 2016-03-22 DIAGNOSIS — N179 Acute kidney failure, unspecified: Secondary | ICD-10-CM

## 2016-03-22 LAB — BASIC METABOLIC PANEL
ANION GAP: 4 — AB (ref 5–15)
Anion gap: 6 (ref 5–15)
BUN: 18 mg/dL (ref 6–20)
BUN: 15 mg/dL (ref 6–20)
CALCIUM: 8.8 mg/dL — AB (ref 8.9–10.3)
CHLORIDE: 101 mmol/L (ref 101–111)
CO2: 30 mmol/L (ref 22–32)
CO2: 29 mmol/L (ref 22–32)
CREATININE: 1.66 mg/dL — AB (ref 0.61–1.24)
Calcium: 8.8 mg/dL — ABNORMAL LOW (ref 8.9–10.3)
Chloride: 103 mmol/L (ref 101–111)
Creatinine, Ser: 1.89 mg/dL — ABNORMAL HIGH (ref 0.61–1.24)
GFR calc Af Amer: 43 mL/min — ABNORMAL LOW (ref >60)
GFR calc Af Amer: 50 mL/min — ABNORMAL LOW (ref >60)
GFR calc non Af Amer: 43 mL/min — ABNORMAL LOW (ref >60)
GFR, EST NON AFRICAN AMERICAN: 37 mL/min — AB (ref >60)
GLUCOSE: 197 mg/dL — AB (ref 65–99)
Glucose, Bld: 280 mg/dL — ABNORMAL HIGH (ref 65–99)
Potassium: 3.4 mmol/L — ABNORMAL LOW (ref 3.5–5.1)
Potassium: 3.4 mmol/L — ABNORMAL LOW (ref 3.5–5.1)
SODIUM: 137 mmol/L (ref 135–145)
SODIUM: 136 mmol/L (ref 135–145)

## 2016-03-22 LAB — GLUCOSE, CAPILLARY
GLUCOSE-CAPILLARY: 426 mg/dL — AB (ref 65–99)
GLUCOSE-CAPILLARY: 479 mg/dL — AB (ref 65–99)
GLUCOSE-CAPILLARY: 168 mg/dL — AB (ref 65–99)
GLUCOSE-CAPILLARY: 160 mg/dL — AB (ref 65–99)
Glucose-Capillary: 204 mg/dL — ABNORMAL HIGH (ref 65–99)
Glucose-Capillary: 258 mg/dL — ABNORMAL HIGH (ref 65–99)
Glucose-Capillary: 273 mg/dL — ABNORMAL HIGH (ref 65–99)

## 2016-03-22 LAB — TROPONIN I: Troponin I: <0.03 ng/mL (ref <0.03)

## 2016-03-22 LAB — NM MYOCAR MULTI W/SPECT W/WALL MOTION / EF
CHL CUP MPHR: 159 {beats}/min
CSEPHR: 52 %
Estimated workload: 1 METS
Peak HR: 84 {beats}/min
Rest HR: 49 {beats}/min

## 2016-03-22 LAB — SODIUM, URINE, RANDOM: Sodium, Ur: 74 mmol/L

## 2016-03-22 LAB — LIPID PANEL
Cholesterol: 259 mg/dL — ABNORMAL HIGH (ref 0–200)
HDL: 32 mg/dL — AB (ref >40)
LDL CALC: UNDETERMINED mg/dL (ref 0–99)
TRIGLYCERIDES: 412 mg/dL — AB (ref <150)
Total CHOL/HDL Ratio: 8.1 RATIO
VLDL: UNDETERMINED mg/dL (ref 0–40)

## 2016-03-22 LAB — RAPID URINE DRUG SCREEN, HOSP PERFORMED
Amphetamines: NOT DETECTED
Barbiturates: NOT DETECTED
Benzodiazepines: NOT DETECTED
Cocaine: NOT DETECTED
Opiates: NOT DETECTED
Tetrahydrocannabinol: NOT DETECTED

## 2016-03-22 LAB — CREATININE, URINE, RANDOM: Creatinine, Urine: 44.47 mg/dL

## 2016-03-22 MED ORDER — MORPHINE SULFATE (PF) 4 MG/ML IV SOLN
4.0000 mg | Freq: Once | INTRAVENOUS | Status: AC
  Administered 2016-03-22: 4 mg via INTRAVENOUS
  Filled 2016-03-22: qty 1

## 2016-03-22 MED ORDER — INSULIN ASPART 100 UNIT/ML ~~LOC~~ SOLN
0.0000 [IU] | Freq: Three times a day (TID) | SUBCUTANEOUS | Status: DC
  Administered 2016-03-22 – 2016-03-23 (×2): 5 [IU] via SUBCUTANEOUS

## 2016-03-22 MED ORDER — MORPHINE SULFATE (PF) 2 MG/ML IV SOLN: 2.0000 mg | INTRAVENOUS | Status: DC | PRN

## 2016-03-22 MED ORDER — AMLODIPINE BESYLATE 2.5 MG PO TABS: 2.5000 mg | ORAL_TABLET | Freq: Every day | ORAL | 1 refills | Status: DC

## 2016-03-22 MED ORDER — TAMSULOSIN HCL 0.4 MG PO CAPS
0.4000 mg | ORAL_CAPSULE | Freq: Every day | ORAL | Status: DC
  Administered 2016-03-22 – 2016-03-24 (×3): 0.4 mg via ORAL
  Filled 2016-03-22 (×3): qty 1

## 2016-03-22 MED ORDER — GLIMEPIRIDE 2 MG PO TABS: 2.0000 mg | ORAL_TABLET | Freq: Every day | ORAL | 3 refills | Status: DC

## 2016-03-22 MED ORDER — INSULIN ASPART 100 UNIT/ML ~~LOC~~ SOLN
0.0000 [IU] | Freq: Every day | SUBCUTANEOUS | Status: DC
  Administered 2016-03-23: 3 [IU] via SUBCUTANEOUS

## 2016-03-22 MED ORDER — TECHNETIUM TC 99M TETROFOSMIN IV KIT
30.0000 | PACK | Freq: Once | INTRAVENOUS | Status: AC | PRN
  Administered 2016-03-22: 30 via INTRAVENOUS

## 2016-03-22 MED ORDER — ASPIRIN 81 MG PO CHEW
81.0000 mg | CHEWABLE_TABLET | ORAL | Status: AC
  Administered 2016-03-23: 81 mg via ORAL
  Filled 2016-03-22: qty 1

## 2016-03-22 MED ORDER — ATORVASTATIN CALCIUM 20 MG PO TABS
20.0000 mg | ORAL_TABLET | Freq: Every day | ORAL | Status: DC
  Administered 2016-03-22 (×2): 20 mg via ORAL
  Filled 2016-03-22 (×2): qty 1

## 2016-03-22 MED ORDER — SODIUM CHLORIDE 0.9 % IV SOLN
INTRAVENOUS | Status: DC
  Administered 2016-03-22: 08:00:00 via INTRAVENOUS

## 2016-03-22 MED ORDER — SODIUM CHLORIDE 0.9% FLUSH: 3.0000 mL | INTRAVENOUS | Status: DC | PRN

## 2016-03-22 MED ORDER — POTASSIUM CHLORIDE CRYS ER 20 MEQ PO TBCR
40.0000 meq | EXTENDED_RELEASE_TABLET | Freq: Once | ORAL | Status: AC
  Administered 2016-03-22: 40 meq via ORAL
  Filled 2016-03-22: qty 2

## 2016-03-22 MED ORDER — SODIUM CHLORIDE 0.9 % IV SOLN
INTRAVENOUS | Status: DC
  Administered 2016-03-22: via INTRAVENOUS

## 2016-03-22 MED ORDER — ACETAMINOPHEN 325 MG PO TABS
650.0000 mg | ORAL_TABLET | ORAL | Status: DC | PRN
  Administered 2016-03-23: 650 mg via ORAL
  Filled 2016-03-22: qty 2

## 2016-03-22 MED ORDER — INSULIN ASPART 100 UNIT/ML ~~LOC~~ SOLN: 0.0000 [IU] | SUBCUTANEOUS | Status: DC

## 2016-03-22 MED ORDER — ATORVASTATIN CALCIUM 20 MG PO TABS
20.0000 mg | ORAL_TABLET | Freq: Every day | ORAL | 3 refills | Status: DC
Start: 2016-03-22 — End: 2016-03-24

## 2016-03-22 MED ORDER — METFORMIN HCL 1000 MG PO TABS: 1000.0000 mg | ORAL_TABLET | Freq: Two times a day (BID) | ORAL | 3 refills | Status: DC

## 2016-03-22 MED ORDER — TECHNETIUM TC 99M TETROFOSMIN IV KIT
10.0000 | PACK | Freq: Once | INTRAVENOUS | Status: AC | PRN
  Administered 2016-03-22: 10 via INTRAVENOUS

## 2016-03-22 MED ORDER — ONDANSETRON HCL 4 MG/2ML IJ SOLN: 4.0000 mg | Freq: Four times a day (QID) | INTRAMUSCULAR | Status: DC | PRN

## 2016-03-22 MED ORDER — ASPIRIN EC 325 MG PO TBEC
325.0000 mg | DELAYED_RELEASE_TABLET | Freq: Every day | ORAL | Status: DC
  Administered 2016-03-22 – 2016-03-23 (×2): 325 mg via ORAL
  Filled 2016-03-22 (×2): qty 1

## 2016-03-22 MED ORDER — INSULIN ASPART 100 UNIT/ML ~~LOC~~ SOLN
7.0000 [IU] | Freq: Once | SUBCUTANEOUS | Status: AC
  Administered 2016-03-22: 7 [IU] via SUBCUTANEOUS

## 2016-03-22 MED ORDER — LACTATED RINGERS IV SOLN
INTRAVENOUS | Status: DC
  Administered 2016-03-22: 02:00:00 via INTRAVENOUS

## 2016-03-22 MED ORDER — REGADENOSON 0.4 MG/5ML IV SOLN
INTRAVENOUS | Status: AC
  Filled 2016-03-22: qty 5

## 2016-03-22 MED ORDER — SODIUM CHLORIDE 0.9% FLUSH
3.0000 mL | Freq: Two times a day (BID) | INTRAVENOUS | Status: DC
  Administered 2016-03-23: 3 mL via INTRAVENOUS

## 2016-03-22 MED ORDER — HYDRALAZINE HCL 20 MG/ML IJ SOLN: 10.0000 mg | INTRAMUSCULAR | Status: DC | PRN

## 2016-03-22 MED ORDER — REGADENOSON 0.4 MG/5ML IV SOLN
0.4000 mg | Freq: Once | INTRAVENOUS | Status: AC
  Administered 2016-03-22: 0.4 mg via INTRAVENOUS
  Filled 2016-03-22: qty 5

## 2016-03-22 MED ORDER — GI COCKTAIL ~~LOC~~: 30.0000 mL | Freq: Four times a day (QID) | ORAL | Status: DC | PRN

## 2016-03-22 MED ORDER — ENOXAPARIN SODIUM 40 MG/0.4ML ~~LOC~~ SOLN
40.0000 mg | SUBCUTANEOUS | Status: DC
  Administered 2016-03-22 – 2016-03-24 (×3): 40 mg via SUBCUTANEOUS
  Filled 2016-03-22 (×3): qty 0.4

## 2016-03-22 MED ORDER — SODIUM CHLORIDE 0.9 % IV SOLN: 250.0000 mL | INTRAVENOUS | Status: DC | PRN

## 2016-03-22 NOTE — Progress Notes (Signed)
The patient's lab collected earlier for his troponin was send to a wrong facility per lab personal. Troponin is being drown STAT. Will continue to monitor.

## 2016-03-22 NOTE — Progress Notes (Signed)
[facility]                                                                              Patient Demographics  Manuel [name], is a 60 y.o. male, DOB - 04-02-1956, TFT:[id number]  Admit date - 03/21/2016   Admitting Physician Karmen Bongo, MD  Outpatient Primary MD for the patient is Maximino Greenland, MD  Outpatient specialists:   LOS - 0  days    Chief Complaint  Patient presents with  . Dizziness  . Chest Pain       Brief summary   Manuel [name] is a 60 y.o. male with medical history significant of DM, HTN, HLD, and BPH who reports about 0200 he woke up sweating with substernal chest pressure.  Started seeing black and white dots.  Started breathing heavily.  Then felt extreme exhaustion.  Would take 2-3 steps and then felt too tired to go on.  Symptoms lasted off and on all day.  Took Excedrin with a tad bit of relief but chest pressure still present.  Waited until 1600 to come into ER because he was hoping the meds would give relief but he finally accepted that they wouldn't.  Non-exertional.  Not reproducible.  Has not yet received NTG and so doesn't know if this will help.  Pressure is much better than it was earlier, has relieved spotaneously.  Currently 3/10, down from 7/10.     Last stress test (GXT) was many years ago.  No h/o heart cath. Reports significant work stress.  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 High risk with hypertension, uncontrolled hyperlipidemia, uncontrolled diabetes mellitus - Serial cardiac enzymes negative. EKG showed nonspecific ST-T wave changes with poor R-wave progression. Per patient had a remote stress test years ago which was normal. - Continue aspirin, placed on statin, needs to control his diabetes better. Does not check his blood sugars at home - Appreciate cardiology recommendations,  nuclear medicine stress test today. 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 Hemoglobin A1c is pending - CBG 479 at the time of admission - Patient was placed on sliding scale insulin.  - Under the FDA's latest recommendations, If the eGFR is below 30 mL/min per 1.73 m2 - defined as advanced renal disease - metformin is contraindicated. - Creatinine is improved, GFR 43. Will continue metformin, add Amaryl 2 mg daily - UA negative for proteinuria   HTN - Good control however patient is on ACE inhibitor and chlorthalidone however given his acute  renal insufficiency, holding both.  - Placed on low-dose Norvasc 2.5 mg daily, outpatient close follow-up with PCP and reassess if patient can be restarted on ACE inhibitor.  HLD -Not on statin therapy at home -Lipid panel showed cholesterol 259, triglycerides 412, high LDL, due to metabolic syndrome from uncontrolled diabetes mellitus -will start Lipitor 20 mg   BPH -Reports taking Flomax -It is not on his current medication list -Can resume at discharge  Code Status: Full DVT Prophylaxis:  Lovenox  Family Communication: Discussed in detail with the patient, all imaging results, lab results explained to the patient    Disposition Plan: Await stress test results  Time Spent in minutes  25 minutes  Procedures:  Nuclear medicine stress test Renal ultrasound  Consultants:    Cardiology  Antimicrobials:      Medications  Scheduled Meds: . aspirin EC  325 mg Oral Daily  . atorvastatin  20 mg Oral q1800  . enoxaparin (LOVENOX) injection  40 mg Subcutaneous Q24H  . insulin aspart  0-15 Units Subcutaneous Q4H  . potassium chloride  40 mEq Oral Once  . regadenoson       Continuous Infusions: . sodium chloride     PRN Meds:.acetaminophen, gi cocktail, hydrALAZINE, morphine injection, ondansetron (ZOFRAN) IV   Antibiotics   Anti-infectives    None        Subjective:   Manuel [name] was seen and examined today. No complaints but overall anxious about the stress test and diabetes. No chest pain. Patient denies dizziness, shortness of breath, abdominal pain, N/V/D/C, new weakness, numbess, tingling. No acute events overnight.    Objective:   Vitals:   03/22/16 0912 03/22/16 0917 03/22/16 0919 03/22/16 0921  BP: 130/75 134/79 127/70 126/75  Pulse: (!) 48 60 70 72  Resp:      Temp:      TempSrc:      SpO2:      Weight:      Height:        Intake/Output Summary (Last 24 hours) at 03/22/16 1018 Last data filed at 03/22/16 0700  Gross per 24 hour  Intake          1692.92 ml  Output              100 ml  Net          1592.92 ml     Wt Readings from Last 3 Encounters:  03/22/16 84.7 kg (186 lb 11.2 oz)  11/02/14 85 kg (187 lb 7 oz)     Exam  General: Alert and oriented x 3, NAD  HEENT:  PERRLA, EOMI, Anicteric Sclera, mucous membranes moist.   Neck: Supple, no JVD, no masses  Cardiovascular: S1 S2 auscultated, no rubs, murmurs or gallops. Regular rate and rhythm.  Respiratory: Clear to auscultation bilaterally, no wheezing, rales or rhonchi  Gastrointestinal: Soft, nontender, nondistended, + bowel sounds  Ext: no cyanosis clubbing or edema  Neuro: AAOx3, Cr N's II- XII. Strength 5/5 upper and lower extremities bilaterally  Skin: No rashes  Psych: Normal affect and demeanor, alert and oriented x3    Data Reviewed:  I  have personally reviewed following labs and imaging studies  Micro Results No results found for this or any previous visit (from the past 240 hour(s)).  Radiology Reports Dg Chest 2 View  Result Date: 03/21/2016 CLINICAL DATA:  Center chest pain, SOB and fatigue x2 days. Hx of HTN, DM, Nonsmoker. EXAM: CHEST  2 VIEW COMPARISON:  None. FINDINGS:  Midline trachea. Normal heart size and mediastinal contours. No pleural effusion or pneumothorax. Lower lobe predominant pulmonary interstitial prominence. No lobar consolidation. IMPRESSION: Lower lobe predominant pulmonary interstitial prominence. This is of indeterminate acuity and significance. Although this could relate to chronic bronchitis, in this nonsmoker, viral or atypical bacterial infection cannot be excluded. Comparison with prior radiographs would be useful if available. Electronically Signed   By: Abigail Miyamoto M.D.   On: 03/21/2016 18:06    Lab Data:  CBC:  Recent Labs Lab 03/21/16 1656  WBC 5.9  HGB 13.7  HCT 40.3  MCV 91.8  PLT 458   Basic Metabolic Panel:  Recent Labs Lab 03/21/16 1656 03/22/16 0829  NA 132* 137  K 3.6 3.4*  CL 98* 103  CO2 26 30  GLUCOSE 363* 197*  BUN 20 18  CREATININE 2.08* 1.89*  CALCIUM 9.3 8.8*   GFR: Estimated Creatinine Clearance: 42.7 mL/min (by C-G formula based on SCr of 1.89 mg/dL). Liver Function Tests: No results for input(s): AST, ALT, ALKPHOS, BILITOT, PROT, ALBUMIN in the last 168 hours. No results for input(s): LIPASE, AMYLASE in the last 168 hours. No results for input(s): AMMONIA in the last 168 hours. Coagulation Profile: No results for input(s): INR, PROTIME in the last 168 hours. Cardiac Enzymes:  Recent Labs Lab 03/21/16 2150 03/22/16 0204 03/22/16 0635  TROPONINI <0.03 <0.03 <0.03   BNP (last 3 results) No results for input(s): PROBNP in the last 8760 hours. HbA1C: No results for input(s): HGBA1C in the last 72 hours. CBG:  Recent Labs Lab 03/22/16 0407  03/22/16 0411 03/22/16 0659  GLUCAP 479* 426* 168*   Lipid Profile:  Recent Labs  03/22/16 0024  CHOL 259*  HDL 32*  LDLCALC UNABLE TO CALCULATE IF TRIGLYCERIDE OVER 400 mg/dL  TRIG 412*  CHOLHDL 8.1   Thyroid Function Tests:  Recent Labs  03/21/16 2150  TSH 2.070   Anemia Panel: No results for input(s): VITAMINB12, FOLATE, FERRITIN, TIBC, IRON, RETICCTPCT in the last 72 hours. Urine analysis:    Component Value Date/Time   COLORURINE YELLOW 03/21/2016 [place] 03/21/2016 2225   LABSPEC 1.026 03/21/2016 2225   PHURINE 5.5 03/21/2016 2225   GLUCOSEU >1000 (A) 03/21/2016 2225   HGBUR NEGATIVE 03/21/2016 2225   BILIRUBINUR NEGATIVE 03/21/2016 2225   KETONESUR NEGATIVE 03/21/2016 2225   PROTEINUR NEGATIVE 03/21/2016 2225   NITRITE NEGATIVE 03/21/2016 2225   LEUKOCYTESUR NEGATIVE 03/21/2016 2225     , M.D. [facility] 03/22/2016, 10:18 AM  Pager: [phone] Between 7am to 7pm - call Pager - [phone]  After 7pm go to [email or web address] - password [email or web address]  Call night coverage person covering after 7pm

## 2016-03-22 NOTE — Progress Notes (Signed)
Pt blood sugar is 360 not able to see it on the result due to glucometer issues. MD notified new order received.

## 2016-03-22 NOTE — Progress Notes (Signed)
Pt arrived

## 2016-03-22 NOTE — Progress Notes (Signed)
      NUC stress intermediate risk. EF 43%. Mild anterolateral apical ischemia. TID 1.31  Discussed with him. Discussed with Dr. Tana Coast. Given findings, will proceed with left heart cath. Risks and benefits discussed including stroke, MI, death, renal impairment.   Creat trending down. Hydrating. Off metformin and Ace-I.  Many questions answered.   Candee Furbish, MD

## 2016-03-22 NOTE — Progress Notes (Signed)
The patient was seen in nuclear medicine for a Lexiscan myoview. He tolerated the procedure well. No acute ST or TW changes on ECG.    STRESS IMAGES TO [place], NP  [place]

## 2016-03-22 NOTE — Consult Note (Signed)
Admit date: 03/21/2016 Referring Physician  Dr. Tana Coast Primary Physician Maximino Greenland, MD Primary Cardiologist  New Reason for Consultation  Chest pain  HPI: 60 year old with DM, HTN, HL who presented with chest discomfort, waking up at approximate 2 AM yesterday morning with substernal chest pressure initially describes a 10 over 10 crushing chest pain, sweating as well as associated symptoms of seeing white and black dots. He felt nervous and started to breathe heavily and then felt what is described as significant exhaustion. He got up and took a couple steps but then was very very tired. The symptoms went on throughout the day, Excedrin was tried but no relief. He came in the ER after he could not get relief. The pressure is much better than it was previously and upon arrival to the ER he was a 3/10 chest pain down from 7/10. He has not noted any chest discomfort during exertional activity. No fevers, chills, orthopnea, PND, bleeding, syncope.  Never has had a heart catheterization but had a stress test many many years ago. 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  Overnight, troponins have all been normal. His triglycerides were 412 with LDL unable to calculate. TSH is 2.0. D-dimer is normal. Creatinine is increased to 2.08 from 1.3 to last year. EKG shows sinus rhythm with poor R-wave progression and nonspecific ST-T wave changes. Repeat EKG shows sinus bradycardia with similar nonspecific ST-T wave changes. Chest x-ray with left lower lobe prominence, low likelihood of pneumonia. Urine toxicology is normal.   PMH:   Past Medical History:  Diagnosis Date  . BPH (benign prostatic hyperplasia)   . Diabetes mellitus without complication ([place])   . Hyperlipidemia   . Hypertension     PSH:  History reviewed. No pertinent surgical history. Allergies:  Review of patient's allergies  indicates no known allergies. Prior to Admit Meds:   Prior to Admission medications   Medication Sig Start Date End Date Taking? Authorizing Provider  Azilsartan-Chlorthalidone (EDARBYCLOR) 40-12.5 MG TABS Take 1 tablet by mouth daily.   Yes Historical Provider, MD  metFORMIN (GLUCOPHAGE) 1000 MG tablet Take 500-1,000 mg by mouth daily.    Yes Historical Provider, MD   Fam HX:    Family History  Problem Relation Age of Onset  . Scleroderma Mother 69  . Congestive Heart Failure Father 72   Social HX:    Social History   Social History  . Marital status: Married    Spouse name: N/A  . Number of children: N/A  . Years of education: N/A   Occupational History  . psychotherapist    Social History Main Topics  . Smoking status: Never Smoker  . Smokeless tobacco: Never Used  . Alcohol use No  . Drug use: No  . Sexual activity: Not on file   Other Topics Concern  . Not on file   Social History Narrative  . No narrative on file     ROS:  All 11 ROS were addressed and are negative except what is stated in the HPI   Physical Exam: Blood pressure 123/69, pulse (!) 49, temperature 97.9 F (36.6 C), temperature source Oral, resp. rate 13, height 5\' 7"  (1.702 m), weight 186 lb 11.2 oz (84.7 kg), SpO2 100 %.   General: Well developed, well nourished, in no acute distress Head: Eyes PERRLA, No xanthomas.   Normal cephalic and atramatic  Lungs:   Clear bilaterally to auscultation and percussion. Normal respiratory effort. No wheezes, no rales. Heart:   HRRR S1 S2 Pulses are 2+ & equal. No murmur, rubs, gallops.  No carotid bruit. No JVD.  No abdominal bruits.  Abdomen: Bowel sounds are positive, abdomen soft and non-tender without masses. No hepatosplenomegaly. Msk:  Back normal. Normal strength and tone for age. Extremities:  No clubbing, cyanosis or edema.  DP +1 Neuro: Alert and oriented X 3, non-focal, MAE x 4 GU: Deferred Rectal: Deferred Psych:  Good affect, responds  appropriately, mildly tired appearing      Labs: Lab Results  Component Value Date   WBC 5.9 03/21/2016   HGB 13.7 03/21/2016   HCT 40.3 03/21/2016   MCV 91.8 03/21/2016   PLT 268 03/21/2016     Recent Labs Lab 03/21/16 1656  NA 132*  K 3.6  CL 98*  CO2 26  BUN 20  CREATININE 2.08*  CALCIUM 9.3  GLUCOSE 363*    Recent Labs  03/21/16 2150 03/22/16 0204 03/22/16 0635  TROPONINI <0.03 <0.03 <0.03   Lab Results  Component Value Date   CHOL 259 (H) 03/22/2016   HDL 32 (L) 03/22/2016   LDLCALC UNABLE TO CALCULATE IF TRIGLYCERIDE OVER 400 mg/dL 03/22/2016   TRIG 412 (H) 03/22/2016   Lab Results  Component Value Date   DDIMER 0.34 03/21/2016     Radiology:  Dg Chest 2 View  Result Date: 03/21/2016 CLINICAL DATA:  Center chest pain, SOB and fatigue x2 days. Hx of HTN, DM, Nonsmoker. EXAM: CHEST  2 VIEW COMPARISON:  None. FINDINGS: Midline trachea. Normal heart size and mediastinal contours. No pleural effusion or pneumothorax. Lower lobe predominant pulmonary interstitial prominence. No lobar consolidation. IMPRESSION: Lower lobe predominant pulmonary interstitial prominence. This is of indeterminate acuity and significance. Although this could relate to chronic bronchitis, in this nonsmoker, viral or atypical bacterial infection cannot be excluded. Comparison with prior radiographs would be useful if available. Electronically Signed   By: Abigail Miyamoto M.D.   On: 03/21/2016 18:06   Personally viewed.  EKG:  As described above in history of present illness, nonspecific ST-T wave changes, poor R-wave progression. Personally viewed.   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  Chest pain  - We will proceed with nuclear stress test for further risk  stratification, to exclude ischemia. He has not had any exertional components leading up to his discomfort yesterday early in the morning. Stress/anxiety could be playing a component. Reassuring cardiac markers. Also, his hyperglycemia could be playing a role in his generalized fatigue. Renal function was abnormal on admission, he is currently getting fluids. Watch for any signs of infection. If nuclear stress test is abnormal, we will consider cardiac catheterization.  Diabetes  - Diabetes is a coronary artery disease problem. High risk factor. Blood sugar was elevated this morning. Continue management per primary team.  Hypertriglyceridemia/hyperlipidemia  - Given his diabetes, I will recommend statin therapy. Reduction in blood sugar should also help with hypertriglyceridemia.    Candee Furbish, MD  03/22/2016  7:57 AM

## 2016-03-23 ENCOUNTER — Encounter (HOSPITAL_COMMUNITY)
Admission: EM | Disposition: A | Discharge status: Home / Self Care | Payer: Self-pay | Source: Home / Self Care | Attending: Emergency Medicine

## 2016-03-23 DIAGNOSIS — R079 Chest pain, unspecified: Secondary | ICD-10-CM

## 2016-03-23 DIAGNOSIS — N183 Chronic kidney disease, stage 3 (moderate): Secondary | ICD-10-CM

## 2016-03-23 DIAGNOSIS — I2510 Atherosclerotic heart disease of native coronary artery without angina pectoris: Secondary | ICD-10-CM

## 2016-03-23 DIAGNOSIS — R072 Precordial pain: Secondary | ICD-10-CM

## 2016-03-23 DIAGNOSIS — R9439 Abnormal result of other cardiovascular function study: Secondary | ICD-10-CM

## 2016-03-23 HISTORY — PX: CARDIAC CATHETERIZATION: SHX172

## 2016-03-23 LAB — CBC WITH DIFFERENTIAL/PLATELET
BASOS ABS: 0 10*3/uL (ref 0.0–0.1)
Basophils Relative: 0 %
EOS PCT: 1 %
Eosinophils Absolute: 0.1 10*3/uL (ref 0.0–0.7)
HEMATOCRIT: 35.1 % — AB (ref 39.0–52.0)
HEMOGLOBIN: 11.9 g/dL — AB (ref 13.0–17.0)
LYMPHS PCT: 34 %
Lymphs Abs: 1.7 10*3/uL (ref 0.7–4.0)
MCH: 31.2 pg (ref 26.0–34.0)
MCHC: 33.9 g/dL (ref 30.0–36.0)
MCV: 92.1 fL (ref 78.0–100.0)
Monocytes Absolute: 0.4 10*3/uL (ref 0.1–1.0)
Monocytes Relative: 8 %
NEUTROS ABS: 2.8 10*3/uL (ref 1.7–7.7)
NEUTROS PCT: 57 %
PLATELETS: 195 10*3/uL (ref 150–400)
RBC: 3.81 MIL/uL — AB (ref 4.22–5.81)
RDW: 12.5 % (ref 11.5–15.5)
WBC: 4.9 10*3/uL (ref 4.0–10.5)

## 2016-03-23 LAB — PROTIME-INR
INR: 1.07
Prothrombin Time: 13.9 seconds (ref 11.4–15.2)

## 2016-03-23 LAB — BASIC METABOLIC PANEL
Anion gap: 4 — ABNORMAL LOW (ref 5–15)
BUN: 16 mg/dL (ref 6–20)
CHLORIDE: 106 mmol/L (ref 101–111)
CO2: 25 mmol/L (ref 22–32)
CREATININE: 1.58 mg/dL — AB (ref 0.61–1.24)
Calcium: 8.8 mg/dL — ABNORMAL LOW (ref 8.9–10.3)
GFR calc non Af Amer: 46 mL/min — ABNORMAL LOW (ref >60)
GFR, EST AFRICAN AMERICAN: 53 mL/min — AB (ref >60)
GLUCOSE: 337 mg/dL — AB (ref 65–99)
Potassium: 3.8 mmol/L (ref 3.5–5.1)
Sodium: 135 mmol/L (ref 135–145)

## 2016-03-23 LAB — URINE CULTURE: Culture: <10000 — AB

## 2016-03-23 LAB — GLUCOSE, CAPILLARY
GLUCOSE-CAPILLARY: 255 mg/dL — AB (ref 65–99)
GLUCOSE-CAPILLARY: 205 mg/dL — AB (ref 65–99)
GLUCOSE-CAPILLARY: 125 mg/dL — AB (ref 65–99)
Glucose-Capillary: 360 mg/dL — ABNORMAL HIGH (ref 65–99)
Glucose-Capillary: 245 mg/dL — ABNORMAL HIGH (ref 65–99)
Glucose-Capillary: 137 mg/dL — ABNORMAL HIGH (ref 65–99)
Glucose-Capillary: 300 mg/dL — ABNORMAL HIGH (ref 65–99)
Glucose-Capillary: 260 mg/dL — ABNORMAL HIGH (ref 65–99)

## 2016-03-23 LAB — HEMOGLOBIN A1C
HEMOGLOBIN A1C: 11.9 % — AB (ref 4.8–5.6)
MEAN PLASMA GLUCOSE: 295 mg/dL

## 2016-03-23 SURGERY — LEFT HEART CATH AND CORONARY ANGIOGRAPHY
Anesthesia: LOCAL

## 2016-03-23 MED ORDER — VERAPAMIL HCL 2.5 MG/ML IV SOLN
INTRAVENOUS | Status: DC | PRN
  Administered 2016-03-23: 10 mL via INTRA_ARTERIAL

## 2016-03-23 MED ORDER — IOPAMIDOL (ISOVUE-370) INJECTION 76%
INTRAVENOUS | Status: DC | PRN
  Administered 2016-03-23: 60 mL via INTRAVENOUS

## 2016-03-23 MED ORDER — SODIUM CHLORIDE 0.9% FLUSH
3.0000 mL | Freq: Two times a day (BID) | INTRAVENOUS | Status: DC
  Administered 2016-03-23: 3 mL via INTRAVENOUS

## 2016-03-23 MED ORDER — SODIUM CHLORIDE 0.9 % IV SOLN: 250.0000 mL | INTRAVENOUS | Status: DC | PRN

## 2016-03-23 MED ORDER — LIDOCAINE HCL (PF) 1 % IJ SOLN
INTRAMUSCULAR | Status: DC | PRN
Start: 2016-03-23 — End: 2016-03-23
  Administered 2016-03-23: 2 mL via INTRADERMAL

## 2016-03-23 MED ORDER — VERAPAMIL HCL 2.5 MG/ML IV SOLN
INTRAVENOUS | Status: AC
  Filled 2016-03-23: qty 2

## 2016-03-23 MED ORDER — INSULIN ASPART 100 UNIT/ML ~~LOC~~ SOLN
0.0000 [IU] | Freq: Three times a day (TID) | SUBCUTANEOUS | Status: DC
  Administered 2016-03-24: 7 [IU] via SUBCUTANEOUS
  Administered 2016-03-24: 4 [IU] via SUBCUTANEOUS

## 2016-03-23 MED ORDER — HEPARIN (PORCINE) IN NACL 2-0.9 UNIT/ML-% IJ SOLN
INTRAMUSCULAR | Status: DC | PRN
  Administered 2016-03-23: 1000 mL

## 2016-03-23 MED ORDER — MIDAZOLAM HCL 2 MG/2ML IJ SOLN
INTRAMUSCULAR | Status: AC
  Filled 2016-03-23: qty 2

## 2016-03-23 MED ORDER — ASPIRIN 81 MG PO CHEW
81.0000 mg | CHEWABLE_TABLET | Freq: Every day | ORAL | Status: DC
  Administered 2016-03-24: 81 mg via ORAL
  Filled 2016-03-23: qty 1

## 2016-03-23 MED ORDER — IOPAMIDOL (ISOVUE-370) INJECTION 76%
INTRAVENOUS | Status: AC
  Filled 2016-03-23: qty 100

## 2016-03-23 MED ORDER — LIDOCAINE HCL (PF) 1 % IJ SOLN
INTRAMUSCULAR | Status: AC
  Filled 2016-03-23: qty 30

## 2016-03-23 MED ORDER — FENTANYL CITRATE (PF) 100 MCG/2ML IJ SOLN
INTRAMUSCULAR | Status: DC | PRN
  Administered 2016-03-23: 50 ug via INTRAVENOUS

## 2016-03-23 MED ORDER — MIDAZOLAM HCL 2 MG/2ML IJ SOLN
INTRAMUSCULAR | Status: DC | PRN
Start: 2016-03-23 — End: 2016-03-23
  Administered 2016-03-23: 1 mg via INTRAVENOUS

## 2016-03-23 MED ORDER — HEPARIN SODIUM (PORCINE) 1000 UNIT/ML IJ SOLN
INTRAMUSCULAR | Status: DC | PRN
  Administered 2016-03-23: 4500 [IU] via INTRAVENOUS

## 2016-03-23 MED ORDER — SODIUM CHLORIDE 0.9 % IV SOLN: INTRAVENOUS | Status: AC

## 2016-03-23 MED ORDER — HEPARIN SODIUM (PORCINE) 1000 UNIT/ML IJ SOLN
INTRAMUSCULAR | Status: AC
  Filled 2016-03-23: qty 1

## 2016-03-23 MED ORDER — SODIUM CHLORIDE 0.9% FLUSH: 3.0000 mL | INTRAVENOUS | Status: DC | PRN

## 2016-03-23 MED ORDER — FENTANYL CITRATE (PF) 100 MCG/2ML IJ SOLN
INTRAMUSCULAR | Status: AC
  Filled 2016-03-23: qty 2

## 2016-03-23 MED ORDER — ATORVASTATIN CALCIUM 80 MG PO TABS
80.0000 mg | ORAL_TABLET | Freq: Every day | ORAL | Status: DC
  Filled 2016-03-23: qty 1

## 2016-03-23 MED ORDER — INSULIN ASPART 100 UNIT/ML ~~LOC~~ SOLN: 0.0000 [IU] | Freq: Every day | SUBCUTANEOUS | Status: DC

## 2016-03-23 MED ORDER — INSULIN GLARGINE 100 UNIT/ML ~~LOC~~ SOLN
10.0000 [IU] | Freq: Every day | SUBCUTANEOUS | Status: DC
  Administered 2016-03-24: 10 [IU] via SUBCUTANEOUS
  Filled 2016-03-23 (×2): qty 0.1

## 2016-03-23 MED ORDER — HEPARIN (PORCINE) IN NACL 2-0.9 UNIT/ML-% IJ SOLN
INTRAMUSCULAR | Status: AC
  Filled 2016-03-23: qty 1000

## 2016-03-23 SURGICAL SUPPLY — 9 items
CATH IMPULSE 5F ANG/FL3.5 (CATHETERS) ×2 IMPLANT
CATH INFINITI 5FR AL1 (CATHETERS) ×2 IMPLANT
DEVICE RAD COMP TR BAND LRG (VASCULAR PRODUCTS) ×2 IMPLANT
GLIDESHEATH SLEND SS 6F .021 (SHEATH) ×2 IMPLANT
KIT HEART LEFT (KITS) ×2 IMPLANT
PACK CARDIAC CATHETERIZATION (CUSTOM PROCEDURE TRAY) ×2 IMPLANT
TRANSDUCER W/STOPCOCK (MISCELLANEOUS) ×2 IMPLANT
TUBING CIL FLEX 10 FLL-RA (TUBING) ×2 IMPLANT
WIRE SAFE-T 1.5MM-J .035X260CM (WIRE) ×2 IMPLANT

## 2016-03-23 NOTE — Progress Notes (Signed)
[facility]                                                                              Patient Demographics  Manuel [name], is a 60 y.o. male, DOB - July 30, 1956, [id number]  Admit date - 03/21/2016   Admitting Physician Karmen Bongo, MD  Outpatient Primary MD for the patient is Maximino Greenland, MD  Outpatient specialists:   LOS - 0  days    Chief Complaint  Patient presents with  . Dizziness  . Chest Pain       Brief summary   Manuel [name] is a 60 y.o. male with medical history significant of DM, HTN, HLD, and BPH who reports about 0200 he woke up sweating with substernal chest pressure.  Started seeing black and white dots.  Started breathing heavily.  Then felt extreme exhaustion.  Would take 2-3 steps and then felt too tired to go on.  Symptoms lasted off and on all day.  Took Excedrin with a tad bit of relief but chest pressure still present.  Waited until 1600 to come into ER because he was hoping the meds would give relief but he finally accepted that they wouldn't.  Non-exertional.  Not reproducible.  Has not yet received NTG and so doesn't know if this will help.  Pressure is much better than it was earlier, has relieved spotaneously.  Currently 3/10, down from 7/10.     Last stress test (GXT) was many years ago.  No h/o heart cath. Reports significant work stress.  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 High risk with hypertension, uncontrolled hyperlipidemia, uncontrolled diabetes mellitus - Serial cardiac enzymes negative. EKG showed nonspecific ST-T wave changes with poor R-wave progression. Per patient had a remote stress test years ago which was normal. - Continue aspirin, placed on statin, needs to control his diabetes better. Does not check his blood sugars at home - Stress test positive on 8/20 with a  small area of inducible ischemia involving the apical segment of anterolateral wall, EF 43%, global hypokinesis, cardiology recommended cardiac cath.   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 Hemoglobin A1c is 11.9 - CBG 479 at the time of admission. Will likely benefit from insulin at discharge. Diabetic coordinator and nutrition consult placed - Patient was placed on sliding scale insulin. Add Lantus 10 units - Under the FDA's latest recommendations, If the eGFR is below 30 mL/min per 1.73 m2 - defined as advanced renal disease - metformin is contraindicated. - Creatinine is improving - UA negative for proteinuria   HTN - Good control however  patient is on ACE inhibitor and chlorthalidone however given his acute renal insufficiency, holding both.   HLD -Not on statin therapy at home -Lipid panel showed cholesterol 259, triglycerides 412, high LDL, due to metabolic syndrome from uncontrolled diabetes mellitus -will start Lipitor 20 mg   BPH -Reports taking Flomax, placed back on Flomax  Code Status: Full DVT Prophylaxis:  Lovenox  Family Communication: Discussed in detail with the patient, all imaging results, lab results explained to the patient    Disposition Plan: Awaiting cardiac cath, will need to continue hydration after the cardiac cath to avoid contrast nephropathy    Time Spent in minutes  25 minutes  Procedures:  Nuclear medicine stress test Renal ultrasound  Consultants:   Cardiology  Antimicrobials:       Medications  Scheduled Meds: . aspirin EC  325 mg Oral Daily  . atorvastatin  80 mg Oral q1800  . enoxaparin (LOVENOX) injection  40 mg Subcutaneous Q24H  . insulin aspart  0-20 Units Subcutaneous TID WC  . insulin aspart  0-5 Units Subcutaneous QHS  . sodium chloride flush  3 mL Intravenous Q12H  . tamsulosin  0.4 mg Oral Daily   Continuous Infusions: . sodium chloride 125 mL/hr at 03/22/16 0745  . sodium chloride 125 mL/hr at 03/22/16 2355   PRN Meds:.sodium chloride, acetaminophen, gi cocktail, hydrALAZINE, morphine injection, ondansetron (ZOFRAN) IV, sodium chloride flush   Antibiotics   Anti-infectives    None        Subjective:   Manuel [name] was seen and examined today. No complaints. No chest pain. Patient denies dizziness, shortness of breath, abdominal pain, N/V/D/C, new weakness, numbess, tingling. No acute events overnight.    Objective:   Vitals:   03/22/16 1411 03/22/16 1919 03/22/16 2111 03/23/16 0400  BP: (!) 146/80  140/77 (!) 128/51  Pulse: (!) 47  (!) 53 (!) 48  Resp: _0 Temp: 97.8 F (36.6 C)  98.3 F (36.8 C) 98.1 F (36.7 C)  TempSrc: Oral  Oral Oral  SpO2: 100%  100% 99%  Weight:  86.1 kg (189 lb 14.4 oz)    Height:        Intake/Output Summary (Last 24 hours) at 03/23/16 1137 Last data filed at 03/23/16 0300  Gross per 24 hour  Intake              720 ml  Output              600 ml  Net              120 ml     Wt Readings from Last 3 Encounters:  03/22/16 86.1 kg (189 lb 14.4 oz)  11/02/14 85 kg (187 lb 7 oz)     Exam  General: Alert and oriented x 3, NAD  HEENT:    Neck:   Cardiovascular: S1 S2 auscultated, no rubs, murmurs or gallops. Regular rate and rhythm.  Respiratory: Clear to auscultation bilaterally, no wheezing, rales or rhonchi  Gastrointestinal: Soft, nontender, nondistended, + bowel sounds  Ext: no cyanosis clubbing or edema  Neuro:No new deficits   Skin: No rashes  Psych: Normal  affect and demeanor, alert and oriented x3    Data Reviewed:  I have personally reviewed following labs and imaging studies  Micro Results No results found for this or any previous visit (from the past 240 hour(s)).  Radiology Reports Dg Chest 2 View  Result Date: 03/21/2016 CLINICAL DATA:  Center chest  pain, SOB and fatigue x2 days. Hx of HTN, DM, Nonsmoker. EXAM: CHEST  2 VIEW COMPARISON:  None. FINDINGS: Midline trachea. Normal heart size and mediastinal contours. No pleural effusion or pneumothorax. Lower lobe predominant pulmonary interstitial prominence. No lobar consolidation. IMPRESSION: Lower lobe predominant pulmonary interstitial prominence. This is of indeterminate acuity and significance. Although this could relate to chronic bronchitis, in this nonsmoker, viral or atypical bacterial infection cannot be excluded. Comparison with prior radiographs would be useful if available. Electronically Signed   By: Abigail Miyamoto M.D.   On: 03/21/2016 18:06   US Renal  Result Date: 03/22/2016 CLINICAL DATA:  Acute kidney injury.  Hypertension and diabetes. EXAM: RENAL / URINARY TRACT ULTRASOUND COMPLETE COMPARISON:  None. FINDINGS: Right Kidney: Length: 10.2 cm. Slightly increased echogenicity. Mild cortical volume loss. No focal lesion. No hydronephrosis. Left Kidney: Length: 10.0 cm. Slightly increased echogenicity. Mild cortical volume loss. No hydronephrosis or focal lesion. Bladder: Prominent prostate. IMPRESSION: Kidneys are within normal limits in size, but show some cortical thinning and increased echogenicity consistent with the type of chronic changes the might be seen with hypertension and diabetes. Prominent prostate. Electronically Signed   By: Nelson Chimes M.D.   On: 03/22/2016 11:04   Nm Myocar Multi W/spect W/wall Motion / Ef  Result Date: 03/22/2016 CLINICAL DATA:  Chest pain. Diabetes. Hypertension. Shortness of breath. EXAM: MYOCARDIAL IMAGING WITH SPECT (REST AND  PHARMACOLOGIC-STRESS) GATED LEFT VENTRICULAR WALL MOTION STUDY LEFT VENTRICULAR EJECTION FRACTION TECHNIQUE: Standard myocardial SPECT imaging was performed after resting intravenous injection of 10 mCi Tc-29mtetrofosmin. Subsequently, intravenous infusion of Lexiscan was performed under the supervision of the Cardiology staff. At peak effect of the drug, 30 mCi Tc-947metrofosmin was injected intravenously and standard myocardial SPECT imaging was performed. Quantitative gated imaging was also performed to evaluate left ventricular wall motion, and estimate left ventricular ejection fraction. COMPARISON:  Chest radiograph of 03/21/2016. FINDINGS: Perfusion: No rest defect identified. With stress, there is a subtle small area of mild reversibility suspected within the apical segment of the anterior lateral wall. Wall Motion: Global hypokinesis.  No left ventricular dilatation. Left Ventricular Ejection Fraction: 43 % End diastolic volume 12115l End systolic volume 69 ml IMPRESSION: 1. Possible small area of inducible ischemia involving the apical segment of the anterolateral wall. 2. Global hypokinesis. 3. Left ventricular ejection fraction 43% 4. Non invasive risk stratification*: Intermediate (based on ejection fraction). *2012 Appropriate Use Criteria for Coronary Revascularization Focused Update: J Am Coll Cardiol. 207262;03(5):[email or web address] Electronically Signed   By: KyAbigail Miyamoto.D.   On: 03/22/2016 11:19    Lab Data:  CBC:  Recent Labs Lab 03/21/16 1656 03/23/16 0040  WBC 5.9 4.9  NEUTROABS  --  2.8  HGB 13.7 11.9*  HCT 40.3 35.1*  MCV 91.8 92.1  PLT 268 19384 Basic Metabolic Panel:  Recent Labs Lab 03/21/16 1656 03/22/16 0829 03/22/16 1253 03/23/16 0040  NA 132* 137 136 135  K 3.6 3.4* 3.4* 3.8  CL 98* 103 101 106  CO2 _0 GLUCOSE 363* 197* 280* 337*  BUN _1 CREATININE 2.08* 1.89* 1.66* 1.58*  CALCIUM  9.3 8.8* 8.8* 8.8*   GFR: Estimated Creatinine Clearance: 51.5 mL/min (by C-G formula based on SCr of 1.58 mg/dL). Liver Function Tests: No results for input(s): AST, ALT, ALKPHOS, BILITOT, PROT, ALBUMIN in the last 168 hours. No results for input(s): LIPASE, AMYLASE in the last 168 hours. No results for input(s):  AMMONIA in the last 168 hours. Coagulation Profile:  Recent Labs Lab 03/23/16 0040  INR 1.07   Cardiac Enzymes:  Recent Labs Lab 03/21/16 2150 03/22/16 0204 03/22/16 0635 03/22/16 1203  TROPONINI <0.03 <0.03 <0.03 <0.03   BNP (last 3 results) No results for input(s): PROBNP in the last 8760 hours. HbA1C:  Recent Labs  03/22/16 0204  HGBA1C 11.9*   CBG:  Recent Labs Lab 03/22/16 2147 03/22/16 2316 03/23/16 0359 03/23/16 0655 03/23/16 0905  GLUCAP 160* 273* 255* 245* 205*   Lipid Profile:  Recent Labs  03/22/16 0024  CHOL 259*  HDL 32*  LDLCALC UNABLE TO CALCULATE IF TRIGLYCERIDE OVER 400 mg/dL  TRIG 412*  CHOLHDL 8.1   Thyroid Function Tests:  Recent Labs  03/21/16 2150  TSH 2.070   Anemia Panel: No results for input(s): VITAMINB12, FOLATE, FERRITIN, TIBC, IRON, RETICCTPCT in the last 72 hours. Urine analysis:    Component Value Date/Time   COLORURINE YELLOW 03/21/2016 [place] 03/21/2016 2225   LABSPEC 1.026 03/21/2016 2225   PHURINE 5.5 03/21/2016 2225   GLUCOSEU >1000 (A) 03/21/2016 2225   HGBUR NEGATIVE 03/21/2016 2225   BILIRUBINUR NEGATIVE 03/21/2016 2225   KETONESUR NEGATIVE 03/21/2016 2225   PROTEINUR NEGATIVE 03/21/2016 2225   NITRITE NEGATIVE 03/21/2016 2225   LEUKOCYTESUR NEGATIVE 03/21/2016 2225     RAI,RIPUDEEP M.D. [facility] 03/23/2016, 11:37 AM  Pager: [phone] Between 7am to 7pm - call Pager - [phone]  After 7pm go to [email or web address] - password [email or web address]  Call night coverage person covering after 7pm

## 2016-03-23 NOTE — Progress Notes (Signed)
   03/23/16 1100  Clinical Encounter Type  Visited With Patient  Visit Type Spiritual support  Referral From Nurse  Spiritual Encounters  Spiritual Needs Emotional  Stress Factors  Patient Stress Factors Health changes;Loss of control  Patient distressed about new conditions diagnosed and upcoming catheterization. Very reasoned concerns, to which chaplain listened. Cuyler Vandyken, Chaplain

## 2016-03-23 NOTE — Progress Notes (Signed)
Inpatient Diabetes Program Recommendations  AACE/ADA: New Consensus Statement on Inpatient Glycemic Control (2015)  Target Ranges:  Prepandial:   less than 140 mg/dL      Peak postprandial:   less than 180 mg/dL (1-2 hours)      Critically ill patients:  140 - 180 mg/dL   Results for Manuel [name], Manuel [name] (MRN [id number]) as of 03/23/2016 08:32  Ref. Range 03/22/2016 16:57 03/22/2016 21:47 03/22/2016 23:16 03/23/2016 03:59 03/23/2016 06:55  Glucose-Capillary Latest Ref Range: 65 - 99 mg/dL 258 (H) 160 (H) 273 (H) 255 (H) 245 (H)    Review of Glycemic Control  Diabetes history: DM2 Outpatient Diabetes medications: Metformin 500 mg- 1 gm daily Current orders for Inpatient glycemic control: Novolog correction 0-15 units tid + 0-5 units hs  Inpatient Diabetes Program Recommendations:  Reviewed CBGs and noted labs with A1c pending. Agree with plan for restarting Metformin when appropriate and adding Amaryl. While in the hospital, please consider adding Lantus 17 units daily (0.2 units/kg). Will follow.  Thank you, Nani Gasser. Terrilee Dudzik, RN, MSN, CDE Inpatient Glycemic Control Team Team Pager [phone] (8am-5pm) 03/23/2016 8:35 AM

## 2016-03-23 NOTE — Progress Notes (Deleted)
   03/23/16 1100  Clinical Encounter Type  Visited With Family  Visit Type Death  Referral From Nurse  Spiritual Encounters  Spiritual Needs Prayer;Grief support  Stress Factors  Patient Stress Factors Loss  Chaplain responded to Code Blue on 2H, family already in consult room with doctor, who recommended that they do no more to keep patient alive. Family agreed. Patient shortly after coded and expired. Chaplain stayed with family for an hour or so and offered prayer and support.  Dewayne Jurek, Chaplain

## 2016-03-23 NOTE — Progress Notes (Signed)
Nutrition Education Note  RD consulted for nutrition education for 60 year old male with history of diabetes, HLD, and CKD III who presents with chest pain.   Lipid Panel     Component Value Date/Time   CHOL 259 (H) 03/22/2016 0024   TRIG 412 (H) 03/22/2016 0024   HDL 32 (L) 03/22/2016 0024   CHOLHDL 8.1 03/22/2016 0024   VLDL UNABLE TO CALCULATE IF TRIGLYCERIDE OVER 400 mg/dL 03/22/2016 0024   LDLCALC UNABLE TO CALCULATE IF TRIGLYCERIDE OVER 400 mg/dL 03/22/2016 0024  Hemoglobin A1c pending.  Patient states that he is knowledgeable about a healthy diet and exercise and how this affects his blood glucose control and heart health. He reports that he has been trying to eat healthier with more fruits, vegetables, and lean protein and he has been working out or walking 3x/week. He relates health issues and high blood glucose to stress as he works at [employer]; he eats on the road a lot. Pt talked slowly and was very tangential. Difficult to assess and educate.   RD provided "Carbohydrate Counting for People With Diabetes" handout from the Academy of Nutrition and Dietetics. Reviewed patient's dietary recall. Encouraged fresh fruits and vegetables, lean protein, nuts, healthy oils, as well as whole grain sources of carbohydrates to maximize fiber intake.   Expect fair compliance.  Body mass index is 29.74 kg/m. Pt meets criteria for Overweight based on current BMI.  Current diet order is NPO for heart catheterization. Pt reports having a normal appetite and eating well. He reports losing from 205 lbs to 186 lbs since starting Metformin, now his weight has stabilized. Labs and medications reviewed. No further nutrition interventions warranted at this time. RD contact information provided. If additional nutrition issues arise, please re-consult RD.  Scarlette Ar RD, LDN, CSP Inpatient Clinical Dietitian Pager: [phone] After Hours Pager: [phone]

## 2016-03-23 NOTE — H&P (View-Only) (Signed)
    Subjective:  Denies CP or dyspnea   Objective:  Vitals:   03/22/16 1411 03/22/16 1919 03/22/16 2111 03/23/16 0400  BP: (!) 146/80  140/77 (!) 128/51  Pulse: (!) 47  (!) 53 (!) 48  Resp: 18  17 17   Temp: 97.8 F (36.6 C)  98.3 F (36.8 C) 98.1 F (36.7 C)  TempSrc: Oral  Oral Oral  SpO2: 100%  100% 99%  Weight:  189 lb 14.4 oz (86.1 kg)    Height:        Intake/Output from previous day:  Intake/Output Summary (Last 24 hours) at 03/23/16 0913 Last data filed at 03/23/16 0300  Gross per 24 hour  Intake              720 ml  Output              600 ml  Net              120 ml    Physical Exam: Physical exam: Well-developed well-nourished in no acute distress.  Skin is warm and dry.  HEENT is normal.  Neck is supple.  Chest is clear to auscultation with normal expansion.  Cardiovascular exam is regular rate and rhythm.  Abdominal exam nontender or distended. No masses palpated. Extremities show no edema. neuro grossly intact    Lab Results: Basic Metabolic Panel:  Recent Labs  03/22/16 1253 03/23/16 0040  NA 136 135  K 3.4* 3.8  CL 101 106  CO2 29 25  GLUCOSE 280* 337*  BUN 15 16  CREATININE 1.66* 1.58*  CALCIUM 8.8* 8.8*   CBC:  Recent Labs  03/21/16 1656 03/23/16 0040  WBC 5.9 4.9  NEUTROABS  --  2.8  HGB 13.7 11.9*  HCT 40.3 35.1*  MCV 91.8 92.1  PLT 268 195   Cardiac Enzymes:  Recent Labs  03/22/16 0204 03/22/16 0635 03/22/16 1203  TROPONINI <0.03 <0.03 <0.03     Assessment/Plan:  1 chest pain-patient has ruled out. Nuclear study abnormal. Per Dr. Marlou Porch plan is to proceed with cardiac catheterization today. The risks and benefits including stroke, myocardial infarction and death discussed and patient agrees to proceed. We also discussed risk of contrast nephropathy. Continue aspirin and statin. 2 chronic stage III renal disease-follow renal function closely after procedure. Creatinine has improved with hydration since  admission. 3 hypertension-continue present medications and follow. 4 diabetes mellitus-will need close follow-up with primary care after discharge. 5 hyperlipidemia-increase Lipitor to 80 mg daily.  Check lipids and liver in 4 weeks.  Kirk Ruths 03/23/2016, 9:13 AM

## 2016-03-23 NOTE — Interval H&P Note (Signed)
Cath Lab Visit (complete for each Cath Lab visit)  Clinical Evaluation Leading to the Procedure:   ACS: No.  Non-ACS:    Anginal Classification: CCS III  Anti-ischemic medical therapy: Maximal Therapy (2 or more classes of medications)  Non-Invasive Test Results: Intermediate-risk stress test findings: cardiac mortality 1-3%/year  Prior CABG: No previous CABG      History and Physical Interval Note:  03/23/2016 3:50 PM  Manuel [name]  has presented today for surgery, with the diagnosis of unstable angina  The various methods of treatment have been discussed with the patient and family. After consideration of risks, benefits and other options for treatment, the patient has consented to  Procedure(s): Left Heart Cath and Coronary Angiography (N/A) as a surgical intervention .  The patient's history has been reviewed, patient examined, no change in status, stable for surgery.  I have reviewed the patient's chart and labs.  Questions were answered to the patient's satisfaction.     Belva Crome III

## 2016-03-23 NOTE — Progress Notes (Signed)
    Subjective:  Denies CP or dyspnea   Objective:  Vitals:   03/22/16 1411 03/22/16 1919 03/22/16 2111 03/23/16 0400  BP: (!) 146/80  140/77 (!) 128/51  Pulse: (!) 47  (!) 53 (!) 48  Resp: 18  17 17   Temp: 97.8 F (36.6 C)  98.3 F (36.8 C) 98.1 F (36.7 C)  TempSrc: Oral  Oral Oral  SpO2: 100%  100% 99%  Weight:  189 lb 14.4 oz (86.1 kg)    Height:        Intake/Output from previous day:  Intake/Output Summary (Last 24 hours) at 03/23/16 0913 Last data filed at 03/23/16 0300  Gross per 24 hour  Intake              720 ml  Output              600 ml  Net              120 ml    Physical Exam: Physical exam: Well-developed well-nourished in no acute distress.  Skin is warm and dry.  HEENT is normal.  Neck is supple.  Chest is clear to auscultation with normal expansion.  Cardiovascular exam is regular rate and rhythm.  Abdominal exam nontender or distended. No masses palpated. Extremities show no edema. neuro grossly intact    Lab Results: Basic Metabolic Panel:  Recent Labs  03/22/16 1253 03/23/16 0040  NA 136 135  K 3.4* 3.8  CL 101 106  CO2 29 25  GLUCOSE 280* 337*  BUN 15 16  CREATININE 1.66* 1.58*  CALCIUM 8.8* 8.8*   CBC:  Recent Labs  03/21/16 1656 03/23/16 0040  WBC 5.9 4.9  NEUTROABS  --  2.8  HGB 13.7 11.9*  HCT 40.3 35.1*  MCV 91.8 92.1  PLT 268 195   Cardiac Enzymes:  Recent Labs  03/22/16 0204 03/22/16 0635 03/22/16 1203  TROPONINI <0.03 <0.03 <0.03     Assessment/Plan:  1 chest pain-patient has ruled out. Nuclear study abnormal. Per Dr. Marlou Porch plan is to proceed with cardiac catheterization today. The risks and benefits including stroke, myocardial infarction and death discussed and patient agrees to proceed. We also discussed risk of contrast nephropathy. Continue aspirin and statin. 2 chronic stage III renal disease-follow renal function closely after procedure. Creatinine has improved with hydration since  admission. 3 hypertension-continue present medications and follow. 4 diabetes mellitus-will need close follow-up with primary care after discharge. 5 hyperlipidemia-increase Lipitor to 80 mg daily.  Check lipids and liver in 4 weeks.  Kirk Ruths 03/23/2016, 9:13 AM

## 2016-03-24 ENCOUNTER — Encounter (HOSPITAL_COMMUNITY): Payer: Self-pay | Admitting: Internal Medicine

## 2016-03-24 ENCOUNTER — Observation Stay (HOSPITAL_BASED_OUTPATIENT_CLINIC_OR_DEPARTMENT_OTHER): Payer: Self-pay

## 2016-03-24 DIAGNOSIS — R9439 Abnormal result of other cardiovascular function study: Secondary | ICD-10-CM

## 2016-03-24 DIAGNOSIS — R079 Chest pain, unspecified: Secondary | ICD-10-CM

## 2016-03-24 LAB — ECHOCARDIOGRAM COMPLETE
CHL CUP MV DEC (S): 227
CHL CUP TV REG PEAK VELOCITY: 216 cm/s
EERAT: 10.36
EWDT: 227 ms
FS: 26 % — AB (ref 28–44)
HEIGHTINCHES: 67 in
IV/PV OW: 0.94
LA ID, A-P, ES: 33 mm
LA vol A4C: 44.5 ml
LADIAMINDEX: 1.67 cm/m2
LAVOL: 50.8 mL
LAVOLIN: 25.7 mL/m2
LEFT ATRIUM END SYS DIAM: 33 mm
LV PW d: 11.9 mm — AB (ref 0.6–1.1)
LV e' LATERAL: 7.72 cm/s
LVEEAVG: 10.36
LVEEMED: 10.36
LVOT area: 3.46 cm2
LVOT diameter: 21 mm
Lateral S' vel: 10.7 cm/s
MV Peak grad: 3 mmHg
MV pk E vel: 80 m/s
MVPKAVEL: 66.5 m/s
RV sys press: 22 mmHg
TAPSE: 19.9 mm
TDI e' lateral: 7.72
TDI e' medial: 6.74
TRMAXVEL: 216 cm/s
WEIGHTICAEL: 3038.4 [oz_av]

## 2016-03-24 LAB — GLUCOSE, CAPILLARY
GLUCOSE-CAPILLARY: 221 mg/dL — AB (ref 65–99)
Glucose-Capillary: 200 mg/dL — ABNORMAL HIGH (ref 65–99)

## 2016-03-24 LAB — BASIC METABOLIC PANEL
ANION GAP: 6 (ref 5–15)
BUN: 14 mg/dL (ref 6–20)
CALCIUM: 9.1 mg/dL (ref 8.9–10.3)
CHLORIDE: 106 mmol/L (ref 101–111)
CO2: 27 mmol/L (ref 22–32)
CREATININE: 1.47 mg/dL — AB (ref 0.61–1.24)
GFR calc Af Amer: 58 mL/min — ABNORMAL LOW (ref >60)
GFR calc non Af Amer: 50 mL/min — ABNORMAL LOW (ref >60)
Glucose, Bld: 205 mg/dL — ABNORMAL HIGH (ref 65–99)
POTASSIUM: 3.6 mmol/L (ref 3.5–5.1)
Sodium: 139 mmol/L (ref 135–145)

## 2016-03-24 LAB — HEMOGLOBIN A1C
Hgb A1c MFr Bld: 12.2 % — ABNORMAL HIGH (ref 4.8–5.6)
MEAN PLASMA GLUCOSE: 303 mg/dL

## 2016-03-24 MED ORDER — GLIMEPIRIDE 4 MG PO TABS: 4.0000 mg | ORAL_TABLET | Freq: Every day | ORAL | 3 refills | Status: AC

## 2016-03-24 MED ORDER — METFORMIN HCL 1000 MG PO TABS: 1000.0000 mg | ORAL_TABLET | Freq: Two times a day (BID) | ORAL | 3 refills | Status: AC

## 2016-03-24 MED ORDER — ATORVASTATIN CALCIUM 80 MG PO TABS: 80.0000 mg | ORAL_TABLET | Freq: Every day | ORAL | 3 refills | Status: AC

## 2016-03-24 MED ORDER — ASPIRIN 81 MG PO CHEW: 81.0000 mg | CHEWABLE_TABLET | Freq: Every day | ORAL | 3 refills | Status: AC

## 2016-03-24 NOTE — Progress Notes (Signed)
Inpatient Diabetes Program Recommendations  AACE/ADA: New Consensus Statement on Inpatient Glycemic Control (2015)  Target Ranges:  Prepandial:   less than 140 mg/dL      Peak postprandial:   less than 180 mg/dL (1-2 hours)      Critically ill patients:  140 - 180 mg/dL   Results for Manuel [name], Manuel [name] (MRN [place]:[id number]) as of 03/24/2016 13:18  Ref. Range 03/23/2016 11:44  Hemoglobin A1C Latest Ref Range: 4.8 - 5.6 % 12.2 (H)    Inpatient Diabetes Program Recommendations:   Spoke with pt about A1C results with them and explained what an A1C is, basic pathophysiology of DM Type 2, basic home care, basic diabetes diet nutrition principles, importance of checking CBGs and maintaining good CBG control to prevent long-term and short-term complications. Reviewed signs and symptoms of hyperglycemia and hypoglycemia and how to treat hypoglycemia at home. Also reviewed blood sugar goals at home.  RNs to provide ongoing basic DM education at bedside with this patient.  Discussed insulin with patient and patient states he wants to defer going on insulin and change his diet and add exercise. Explained to patient according to ADA guidelines recommends starting insulin based on elevated A1c. Spoke with Dr. Tana Coast and she shared patient does not want to begin insulin @ this time and recommends oral medications with PCP followup and repeat A1c within 3 months.  Thank you, Nani Gasser. Lataysha Vohra, RN, MSN, CDE Inpatient Glycemic Control Team Team Pager [phone] (8am-5pm) 03/24/2016 1:23 PM

## 2016-03-24 NOTE — Progress Notes (Signed)
Removed IV's. Reviewed discharge instructions with patient and family. Walked out of cone with family.  Lesa Vandall, Mervin Kung RN

## 2016-03-24 NOTE — Progress Notes (Signed)
   03/24/16 1000  Clinical Encounter Type  Visited With Patient and family together  Visit Type Spiritual support  Spiritual Encounters  Spiritual Needs Emotional;Prayer  Stress Factors  Patient Stress Factors Loss of control;Health changes  Chaplain followed up on yesterday's visit, found patient had completed cath and much of his anxiety had dissipated. Discussed future care. Patient seems more comfortable now that he knows managing his diabetes may help control his other issues. Riniyah Speich, Chaplain

## 2016-03-24 NOTE — Discharge Summary (Signed)
Physician Discharge Summary   Patient ID: Manuel [name] MRN: [id number] DOB/AGE: 09-29-1955 60 y.o.  Admit date: 03/21/2016 Discharge date: 03/24/2016  Primary Care Physician:  Maximino Greenland, MD  Discharge Diagnoses:    .Atypical Chest pain . Essential hypertension . Hyperlipidemia . BPH (benign prostatic hyperplasia) . AKI (acute kidney injury) ([place])   Uncontrolled diabetes mellitus hemoglobin A1c 12   Consults:  Cardiology  Recommendations for Outpatient Follow-up:  1. Please repeat CBC/BMET at next visit 2. Please check LFTs, lipid panel, hemoglobin A1c in 4 weeks 3. If creatinine function is stable, restart ACE inhibitor. If BP still uncontrolled, can place on calcium channel blocker.   DIET: Carb modified diet  Allergies:  No Known Allergies   DISCHARGE MEDICATIONS: Current Discharge Medication List    START taking these medications   Details  aspirin 81 MG chewable tablet Chew 1 tablet (81 mg total) by mouth daily. Qty: 30 tablet, Refills: 3    atorvastatin (LIPITOR) 80 MG tablet Take 1 tablet (80 mg total) by mouth at bedtime. Qty: 30 tablet, Refills: 3    glimepiride (AMARYL) 4 MG tablet Take 1 tablet (4 mg total) by mouth daily with breakfast. Qty: 30 tablet, Refills: 3      CONTINUE these medications which have CHANGED   Details  metFORMIN (GLUCOPHAGE) 1000 MG tablet Take 1 tablet (1,000 mg total) by mouth 2 (two) times daily with a meal. Qty: 60 tablet, Refills: 3      CONTINUE these medications which have NOT CHANGED   Details  tamsulosin (FLOMAX) 0.4 MG CAPS capsule Take 0.4 mg by mouth daily.      STOP taking these medications     Azilsartan-Chlorthalidone (EDARBYCLOR) 40-12.5 MG TABS          Brief H and P: For complete details please refer to admission H and P, but in brief Manuel Baumgardner Howardis a 60 y.o.malewith medical history significant of DM, HTN, HLD, and BPH who reports about 0200 he woke upsweating with substernal chest  pressure. Started seeing black and white dots. Started breathing heavily. Then felt extreme exhaustion. Would take 2-3 steps and then felt too tired to go on. Symptoms lasted off and on all day. Took Excedrin with a tad bit of relief but chest pressure still present. Waited until 1600 to come into ER because hewas hoping the meds would give relief but he finally accepted that they wouldn't. Non-exertional. Not reproducible. Has not yet received NTG and so doesn't know if this will help. Pressure is much better than it was earlier, has relieved spotaneously. Currently 3/10, down from 7/10.  Last stress test (GXT) was many years ago. No h/o heart cath. Reports significant work stress. 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 High risk with hypertension, uncontrolled hyperlipidemia, uncontrolled diabetes mellitus - Serial cardiac enzymes negative. EKG showed nonspecific ST-T wave changes with poor R-wave progression. Per patient had a remote stress test years ago which was normal. - Continue aspirin, placed on statin, needs to control his diabetes better. Does not check his blood sugars at home - Stress test positive on 8/20 with a small area of inducible ischemia involving the apical segment of anterolateral wall, EF 43%, global hypokinesis, cardiology recommended cardiac cath.  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 Hemoglobin A1c is 12.2 - CBG 479 at the time of admission. Patient was somewhat overwhelmed with diabetes and did not want to be on insulin at this time. Diabetic coordinator and nutrition consult placed - Patient was placed on sliding scale insulin. Add Lantus 10 units - Under the FDA's latest recommendations, If the eGFR is below 30 mL/min per 1.73 m2 - defined as advanced renal disease - metformin is contraindicated. - Creatinine is improving, GFR currently 58, patient was placed on metformin 1000 mg twice a day and Amaryl 4 mg daily. He was also recommended to follow up with outpatient endocrinology, referral from his PCP. - UA negative for proteinuria   HTN - Good control however patient is on ACE inhibitor and chlorthalidone however given his acute renal insufficiency, holding both. Please check BMET at the time of follow-up appointment. If creatinine is stable, patient will benefit from restarting ACE inhibitor. If BP is somewhat uncontrolled, may also had calcium channel blocker.  HLD -Not on statin therapy at home -Lipid panel showed cholesterol 259, triglycerides 412, high LDL, due to metabolic syndrome from uncontrolled diabetes mellitus -Patient was placed on Lipitor 80 mg daily. Please check LFTs in 4 weeks.  BPH -Reports taking Flomax, placed back on Flomax   Day of Discharge BP (!) 151/82 (BP Location: Left Arm)   Pulse (!) 53   Temp 98.1 F (36.7 C) (Oral)   Resp 19   Ht 5' 7"  (1.702 m)   Wt 86.1 kg (189 lb 14.4 oz)   SpO2 100%   BMI 29.74 kg/m   Physical Exam: General: Alert and  awake oriented x3 not in any acute distress. HEENT: anicteric sclera, pupils reactive to light and accommodation CVS: S1-S2 clear no murmur rubs or gallops Chest: clear to auscultation bilaterally, no wheezing rales or rhonchi Abdomen: soft nontender, nondistended, normal bowel sounds Extremities: no cyanosis, clubbing or edema noted bilaterally Neuro: Cranial nerves II-XII intact, no focal neurological deficits   The results of significant diagnostics from this hospitalization (including imaging, microbiology, ancillary and laboratory) are listed below for reference.    LAB RESULTS: Basic Metabolic Panel:  Recent Labs Lab 03/23/16 0040 03/24/16 0323  NA 135 139  K 3.8 3.6  CL 106 106  CO2 25 27  GLUCOSE 337* 205*  BUN 16 14  CREATININE 1.58* 1.47*  CALCIUM 8.8* 9.1   Liver Function Tests: No results for input(s): AST, ALT, ALKPHOS, BILITOT, PROT, ALBUMIN in the last 168 hours. No results for input(s): LIPASE, AMYLASE in the last 168 hours. No results for input(s): AMMONIA in the last 168 hours. CBC:  Recent Labs Lab 03/21/16 1656 03/23/16 0040  WBC 5.9 4.9  NEUTROABS  --  2.8  HGB 13.7 11.9*  HCT 40.3 35.1*  MCV 91.8 92.1  PLT 268 195   Cardiac Enzymes:  Recent Labs Lab 03/22/16 0635 03/22/16 1203  TROPONINI <0.03 <0.03   BNP: Invalid input(s): POCBNP CBG:  Recent Labs Lab 03/24/16 0619 03/24/16 1116  GLUCAP 200* 221*    Significant Diagnostic Studies:  Dg Chest 2 View  Result Date: 03/21/2016 CLINICAL DATA:  Center chest pain, SOB and fatigue x2 days. Hx of HTN, DM, Nonsmoker. EXAM: CHEST  2 VIEW COMPARISON:  None. FINDINGS: Midline trachea. Normal heart size and mediastinal contours. No pleural effusion or pneumothorax. Lower lobe predominant pulmonary interstitial prominence. No lobar consolidation. IMPRESSION: Lower lobe predominant pulmonary interstitial prominence. This is of indeterminate acuity and significance. Although this could relate to  chronic bronchitis, in this nonsmoker, viral or atypical bacterial infection cannot be excluded. Comparison with prior radiographs would be useful if available. Electronically Signed   By: Abigail Miyamoto M.D.   On: 03/21/2016 18:06   US Renal  Result Date: 03/22/2016 CLINICAL DATA:  Acute kidney injury.  Hypertension and diabetes. EXAM: RENAL / URINARY TRACT ULTRASOUND COMPLETE COMPARISON:  None. FINDINGS: Right Kidney: Length: 10.2 cm. Slightly increased echogenicity. Mild cortical volume loss. No focal lesion. No hydronephrosis. Left Kidney: Length: 10.0 cm. Slightly increased echogenicity. Mild cortical volume loss. No hydronephrosis or focal lesion. Bladder: Prominent prostate. IMPRESSION: Kidneys are within normal limits in size, but show some cortical thinning and increased echogenicity consistent with the type of chronic changes the might be seen with hypertension and diabetes. Prominent prostate. Electronically Signed   By: Nelson Chimes M.D.   On: 03/22/2016 11:04   Nm Myocar Multi W/spect W/wall Motion / Ef  Result Date: 03/22/2016 CLINICAL DATA:  Chest pain. Diabetes. Hypertension. Shortness of breath. EXAM: MYOCARDIAL IMAGING WITH SPECT (REST AND PHARMACOLOGIC-STRESS) GATED LEFT VENTRICULAR WALL MOTION STUDY LEFT VENTRICULAR EJECTION FRACTION TECHNIQUE: Standard myocardial SPECT imaging was performed after resting intravenous injection of 10 mCi Tc-51mtetrofosmin. Subsequently, intravenous infusion of Lexiscan was performed under the supervision of the Cardiology staff. At peak effect of the drug, 30 mCi Tc-94metrofosmin was injected intravenously and standard myocardial SPECT imaging was performed. Quantitative gated imaging was also performed to evaluate left ventricular wall motion, and estimate left ventricular ejection fraction. COMPARISON:  Chest radiograph of 03/21/2016. FINDINGS: Perfusion: No rest defect identified. With stress, there is a subtle small area of mild reversibility  suspected within the apical segment of the anterior lateral wall. Wall Motion: Global hypokinesis.  No left ventricular dilatation. Left Ventricular Ejection Fraction: 43 % End diastolic volume 12967l End systolic volume 69 ml IMPRESSION: 1. Possible small area of inducible ischemia involving the apical segment of the anterolateral wall. 2. Global hypokinesis. 3. Left ventricular ejection fraction 43% 4. Non invasive risk stratification*: Intermediate (based on ejection fraction). *2012 Appropriate Use Criteria for Coronary Revascularization Focused Update: J Am Coll Cardiol. 208938;10(1):751-025http://content.onairportbarriers.comspx?articleid=[email or web address] Electronically Signed   By: KyAbigail Miyamoto.D.   On: 03/22/2016 11:19   Procedures   Left Heart Cath and Coronary Angiography  Conclusion   1. Mild, non-obstructive coronary artery disease. 2. Mildly elevated left ventricular filling pressure.  Plan: 1. Aggressive risk factor management and volume optimization in the setting of acute kidney injury with diastolic dysfunction. 2. Work-up of potential non-coronary etiologies of the patient's chest pain per primary team. 3. Repeat BMP in AM to reassess renal function.     Disposition and Follow-up: Discharge Instructions    Diet Carb Modified    Complete by:  As directed   Increase activity slowly    Complete by:  As directed       DISPOSITION:  Home   DISCHARGE FOLLOW-UP Follow-up Information    SANDERS,ROBYN N, MD. Schedule an appointment as soon as possible for a visit in 2 week(s).   Specialty:  Internal Medicine Contact information: [address] [place] [address] [phone]            Time spent on Discharge: 35 minutes  Signed:   RAI,RIPUDEEP M.D. [facility] 03/24/2016, 1:02 PM Pager: [phone]

## 2016-03-24 NOTE — Care Management Note (Signed)
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   Expected Discharge Date:  03/24/16               Expected Discharge Plan:  Home/Self Care  In-House Referral:  Clinical Social Work  Discharge planning Services  CM Consult  Post Acute Care Choice:    Choice offered to:     DME Arranged:    DME Agency:     HH Arranged:    [place] Agency:     Status of Service:  Completed, signed off  If discussed at [employer], dates discussed:    Additional Comments:  Dawayne Patricia, RN 03/24/2016, 2:47 PM

## 2016-03-24 NOTE — Progress Notes (Signed)
  Echocardiogram 2D Echocardiogram has been performed.  Manuel [name] 03/24/2016, 1:20 PM

## 2016-03-24 NOTE — Clinical Social Work Note (Signed)
CSW consulted for medication assistance. CSW updated RNCM who will follow up. CSW is signing off as no further needs identified.   Darden Dates, MSW, LCSW  Clinical Social Worker  [phone]

## 2016-03-24 NOTE — Progress Notes (Signed)
    Subjective:  Denies CP or dyspnea   Objective:  Vitals:   03/23/16 1942 03/23/16 2023 03/23/16 2030 03/24/16 0529  BP:  131/66 (!) 141/68 (!) 146/73  Pulse:   (!) 55 (!) 50  Resp:    18  Temp:    98.2 F (36.8 C)  TempSrc:    Oral  SpO2: 100% 100% 100% 100%  Weight:      Height:        Intake/Output from previous day:  Intake/Output Summary (Last 24 hours) at 03/24/16 0914 Last data filed at 03/23/16 2115  Gross per 24 hour  Intake              150 ml  Output              525 ml  Net             -375 ml    Physical Exam: Physical exam: Well-developed well-nourished in no acute distress.  Skin is warm and dry.  HEENT is normal.  Neck is supple.  Chest is clear to auscultation with normal expansion.  Cardiovascular exam is regular rate and rhythm.  Abdominal exam nontender or distended. No masses palpated. Extremities show no edema. Radial cath site with no hematoma neuro grossly intact    Lab Results: Basic Metabolic Panel:  Recent Labs  03/23/16 0040 03/24/16 0323  NA 135 139  K 3.8 3.6  CL 106 106  CO2 25 27  GLUCOSE 337* 205*  BUN 16 14  CREATININE 1.58* 1.47*  CALCIUM 8.8* 9.1   CBC:  Recent Labs  03/21/16 1656 03/23/16 0040  WBC 5.9 4.9  NEUTROABS  --  2.8  HGB 13.7 11.9*  HCT 40.3 35.1*  MCV 91.8 92.1  PLT 268 195   Cardiac Enzymes:  Recent Labs  03/22/16 0204 03/22/16 0635 03/22/16 1203  TROPONINI <0.03 <0.03 <0.03     Assessment/Plan:  1 chest pain-patient ruled out. Nuclear study abnormal. However cath did not show significant CAD. Continue aspirin and statin. EF likely underestimated by nuclear study; check echo. 2 chronic stage III renal disease-would recheck Cr in 48 hours. 3 hypertension-would add ACEI following DC once it is clear renal function stable following cath. 4 diabetes mellitus-will need close follow-up with primary care after discharge. Hold glucophage for 48 hrs following cath. 5  hyperlipidemia-continue Lipitor to 80 mg daily.  Check lipids and liver in 4 weeks.  Kirk Ruths 03/24/2016, 9:14 AM

## 2016-03-27 ENCOUNTER — Encounter: Payer: Self-pay | Admitting: *Deleted

## 2016-03-27 DIAGNOSIS — Z006 Encounter for examination for normal comparison and control in clinical research program: Secondary | ICD-10-CM

## 2016-03-27 NOTE — Progress Notes (Signed)
Late entry:  CADLAD Informed Consent   Subject Manuel [name] Subject met inclusion and exclusion criteria.  The informed consent form, study requirements and expectations were reviewed with the subject and questions and concerns were addressed prior to the signing of the consent form.  The subject verbalized understanding of the trail requirements.  The subject agreed to participate in the CADLAD trial and signed the informed consent.  The informed consent was obtained prior to performance of any protocol-specific procedures for the subject.  A copy of the signed informed consent was given to the subject and a copy was placed in the subject's medical record.  [name] 03/23/2016 [id number]

## 2017-11-15 DIAGNOSIS — I129 Hypertensive chronic kidney disease with stage 1 through stage 4 chronic kidney disease, or unspecified chronic kidney disease: Secondary | ICD-10-CM | POA: Diagnosis not present

## 2017-11-15 DIAGNOSIS — I10 Essential (primary) hypertension: Secondary | ICD-10-CM | POA: Diagnosis not present

## 2017-11-15 DIAGNOSIS — Z79899 Other long term (current) drug therapy: Secondary | ICD-10-CM | POA: Diagnosis not present

## 2017-11-15 DIAGNOSIS — E1122 Type 2 diabetes mellitus with diabetic chronic kidney disease: Secondary | ICD-10-CM | POA: Diagnosis not present

## 2018-03-08 ENCOUNTER — Emergency Department (HOSPITAL_COMMUNITY): Payer: BLUE CROSS/BLUE SHIELD

## 2018-03-08 ENCOUNTER — Encounter (HOSPITAL_COMMUNITY): Payer: Self-pay

## 2018-03-08 ENCOUNTER — Observation Stay (HOSPITAL_COMMUNITY)
Admission: EM | Admit: 2018-03-08 | Discharge: 2018-03-11 | DRG: 872 | Disposition: A | Discharge status: Home / Self Care | Payer: BLUE CROSS/BLUE SHIELD | Attending: Internal Medicine | Admitting: Internal Medicine

## 2018-03-08 DIAGNOSIS — N183 Chronic kidney disease, stage 3 unspecified: Secondary | ICD-10-CM | POA: Diagnosis present

## 2018-03-08 DIAGNOSIS — E1122 Type 2 diabetes mellitus with diabetic chronic kidney disease: Secondary | ICD-10-CM | POA: Diagnosis not present

## 2018-03-08 DIAGNOSIS — Z7982 Long term (current) use of aspirin: Secondary | ICD-10-CM | POA: Diagnosis not present

## 2018-03-08 DIAGNOSIS — Z7984 Long term (current) use of oral hypoglycemic drugs: Secondary | ICD-10-CM

## 2018-03-08 DIAGNOSIS — D1809 Hemangioma of other sites: Secondary | ICD-10-CM | POA: Diagnosis not present

## 2018-03-08 DIAGNOSIS — N320 Bladder-neck obstruction: Secondary | ICD-10-CM | POA: Diagnosis not present

## 2018-03-08 DIAGNOSIS — N138 Other obstructive and reflux uropathy: Secondary | ICD-10-CM | POA: Diagnosis not present

## 2018-03-08 DIAGNOSIS — A419 Sepsis, unspecified organism: Secondary | ICD-10-CM | POA: Diagnosis not present

## 2018-03-08 DIAGNOSIS — E785 Hyperlipidemia, unspecified: Secondary | ICD-10-CM | POA: Diagnosis present

## 2018-03-08 DIAGNOSIS — N400 Enlarged prostate without lower urinary tract symptoms: Secondary | ICD-10-CM | POA: Diagnosis present

## 2018-03-08 DIAGNOSIS — Z8249 Family history of ischemic heart disease and other diseases of the circulatory system: Secondary | ICD-10-CM | POA: Diagnosis not present

## 2018-03-08 DIAGNOSIS — I129 Hypertensive chronic kidney disease with stage 1 through stage 4 chronic kidney disease, or unspecified chronic kidney disease: Secondary | ICD-10-CM | POA: Diagnosis present

## 2018-03-08 DIAGNOSIS — N179 Acute kidney failure, unspecified: Secondary | ICD-10-CM | POA: Diagnosis present

## 2018-03-08 DIAGNOSIS — N401 Enlarged prostate with lower urinary tract symptoms: Secondary | ICD-10-CM | POA: Diagnosis not present

## 2018-03-08 DIAGNOSIS — E1165 Type 2 diabetes mellitus with hyperglycemia: Secondary | ICD-10-CM | POA: Diagnosis present

## 2018-03-08 DIAGNOSIS — R338 Other retention of urine: Secondary | ICD-10-CM | POA: Diagnosis present

## 2018-03-08 DIAGNOSIS — N12 Tubulo-interstitial nephritis, not specified as acute or chronic: Secondary | ICD-10-CM | POA: Diagnosis present

## 2018-03-08 DIAGNOSIS — D739 Disease of spleen, unspecified: Secondary | ICD-10-CM

## 2018-03-08 DIAGNOSIS — N136 Pyonephrosis: Secondary | ICD-10-CM | POA: Diagnosis not present

## 2018-03-08 DIAGNOSIS — E1129 Type 2 diabetes mellitus with other diabetic kidney complication: Secondary | ICD-10-CM | POA: Diagnosis present

## 2018-03-08 DIAGNOSIS — Z9114 Patient's other noncompliance with medication regimen: Secondary | ICD-10-CM

## 2018-03-08 DIAGNOSIS — D7389 Other diseases of spleen: Secondary | ICD-10-CM | POA: Diagnosis present

## 2018-03-08 DIAGNOSIS — I10 Essential (primary) hypertension: Secondary | ICD-10-CM

## 2018-03-08 DIAGNOSIS — N1330 Unspecified hydronephrosis: Secondary | ICD-10-CM | POA: Diagnosis present

## 2018-03-08 DIAGNOSIS — Z79899 Other long term (current) drug therapy: Secondary | ICD-10-CM

## 2018-03-08 LAB — COMPREHENSIVE METABOLIC PANEL
ALBUMIN: 3.7 g/dL (ref 3.5–5.0)
ALK PHOS: 104 U/L (ref 38–126)
ALT: 29 U/L (ref 0–44)
AST: 31 U/L (ref 15–41)
Anion gap: 17 — ABNORMAL HIGH (ref 5–15)
BUN: 16 mg/dL (ref 8–23)
CALCIUM: 8.7 mg/dL — AB (ref 8.9–10.3)
CO2: 24 mmol/L (ref 22–32)
Chloride: 93 mmol/L — ABNORMAL LOW (ref 98–111)
Creatinine, Ser: 2.06 mg/dL — ABNORMAL HIGH (ref 0.61–1.24)
GFR calc Af Amer: 38 mL/min — ABNORMAL LOW (ref >60)
GFR calc non Af Amer: 33 mL/min — ABNORMAL LOW (ref >60)
Glucose, Bld: 535 mg/dL (ref 70–99)
Potassium: 3.9 mmol/L (ref 3.5–5.1)
Sodium: 134 mmol/L — ABNORMAL LOW (ref 135–145)
Total Bilirubin: 0.8 mg/dL (ref 0.3–1.2)
Total Protein: 7.1 g/dL (ref 6.5–8.1)

## 2018-03-08 LAB — CBC
HCT: 37.7 % — ABNORMAL LOW (ref 39.0–52.0)
Hemoglobin: 12.4 g/dL — ABNORMAL LOW (ref 13.0–17.0)
MCH: 29.5 pg (ref 26.0–34.0)
MCHC: 32.9 g/dL (ref 30.0–36.0)
MCV: 89.8 fL (ref 78.0–100.0)
Platelets: 225 10*3/uL (ref 150–400)
RBC: 4.2 MIL/uL — ABNORMAL LOW (ref 4.22–5.81)
RDW: 14.1 % (ref 11.5–15.5)
WBC: 20.6 10*3/uL — ABNORMAL HIGH (ref 4.0–10.5)

## 2018-03-08 LAB — CBG MONITORING, ED
GLUCOSE-CAPILLARY: 534 mg/dL — AB (ref 70–99)
Glucose-Capillary: 405 mg/dL — ABNORMAL HIGH (ref 70–99)
Glucose-Capillary: 301 mg/dL — ABNORMAL HIGH (ref 70–99)

## 2018-03-08 LAB — URINALYSIS, ROUTINE W REFLEX MICROSCOPIC
Bilirubin Urine: NEGATIVE
Glucose, UA: >=500 mg/dL — AB
KETONES UR: NEGATIVE mg/dL
Nitrite: NEGATIVE
Protein, ur: NEGATIVE mg/dL
Specific Gravity, Urine: 1.023 (ref 1.005–1.030)
WBC, UA: >50 WBC/hpf — ABNORMAL HIGH (ref 0–5)
pH: 5 (ref 5.0–8.0)

## 2018-03-08 LAB — LIPASE, BLOOD: Lipase: 37 U/L (ref 11–51)

## 2018-03-08 LAB — GLUCOSE, CAPILLARY: GLUCOSE-CAPILLARY: 333 mg/dL — AB (ref 70–99)

## 2018-03-08 MED ORDER — ONDANSETRON HCL 4 MG PO TABS: 4.0000 mg | ORAL_TABLET | Freq: Four times a day (QID) | ORAL | Status: DC | PRN

## 2018-03-08 MED ORDER — ACETAMINOPHEN 325 MG PO TABS: 650.0000 mg | ORAL_TABLET | Freq: Four times a day (QID) | ORAL | Status: DC | PRN

## 2018-03-08 MED ORDER — INSULIN GLARGINE 100 UNIT/ML ~~LOC~~ SOLN
3.0000 [IU] | Freq: Every day | SUBCUTANEOUS | Status: DC
  Administered 2018-03-09 – 2018-03-10 (×3): 3 [IU] via SUBCUTANEOUS
  Filled 2018-03-08 (×3): qty 0.03

## 2018-03-08 MED ORDER — MORPHINE SULFATE (PF) 2 MG/ML IV SOLN
2.0000 mg | INTRAVENOUS | Status: DC | PRN
  Administered 2018-03-11 (×2): 2 mg via INTRAVENOUS
  Filled 2018-03-08 (×2): qty 1

## 2018-03-08 MED ORDER — INSULIN ASPART 100 UNIT/ML IV SOLN: 5.0000 [IU] | Freq: Once | INTRAVENOUS | Status: DC

## 2018-03-08 MED ORDER — ONDANSETRON 4 MG PO TBDP
4.0000 mg | ORAL_TABLET | Freq: Once | ORAL | Status: AC | PRN
  Administered 2018-03-08: 4 mg via ORAL
  Filled 2018-03-08: qty 1

## 2018-03-08 MED ORDER — ACETAMINOPHEN 650 MG RE SUPP: 650.0000 mg | Freq: Four times a day (QID) | RECTAL | Status: DC | PRN

## 2018-03-08 MED ORDER — ASPIRIN 81 MG PO CHEW
81.0000 mg | CHEWABLE_TABLET | Freq: Every day | ORAL | Status: DC
  Administered 2018-03-08 – 2018-03-11 (×4): 81 mg via ORAL
  Filled 2018-03-08 (×4): qty 1

## 2018-03-08 MED ORDER — INSULIN ASPART 100 UNIT/ML ~~LOC~~ SOLN
6.0000 [IU] | Freq: Once | SUBCUTANEOUS | Status: AC
  Administered 2018-03-08: 6 [IU] via INTRAVENOUS
  Filled 2018-03-08: qty 1

## 2018-03-08 MED ORDER — ONDANSETRON HCL 4 MG/2ML IJ SOLN: 4.0000 mg | Freq: Four times a day (QID) | INTRAMUSCULAR | Status: DC | PRN

## 2018-03-08 MED ORDER — INSULIN ASPART 100 UNIT/ML ~~LOC~~ SOLN
0.0000 [IU] | Freq: Three times a day (TID) | SUBCUTANEOUS | Status: DC
  Administered 2018-03-09: 1 [IU] via SUBCUTANEOUS
  Administered 2018-03-09 (×2): 5 [IU] via SUBCUTANEOUS
  Administered 2018-03-10 – 2018-03-11 (×5): 3 [IU] via SUBCUTANEOUS

## 2018-03-08 MED ORDER — HEPARIN SODIUM (PORCINE) 5000 UNIT/ML IJ SOLN
5000.0000 [IU] | Freq: Three times a day (TID) | INTRAMUSCULAR | Status: DC
  Administered 2018-03-08 – 2018-03-11 (×9): 5000 [IU] via SUBCUTANEOUS
  Filled 2018-03-08 (×7): qty 1

## 2018-03-08 MED ORDER — SODIUM CHLORIDE 0.9 % IV SOLN
INTRAVENOUS | Status: DC
  Administered 2018-03-08 – 2018-03-10 (×2): via INTRAVENOUS

## 2018-03-08 MED ORDER — SENNOSIDES-DOCUSATE SODIUM 8.6-50 MG PO TABS: 1.0000 | ORAL_TABLET | Freq: Every evening | ORAL | Status: DC | PRN

## 2018-03-08 MED ORDER — SODIUM CHLORIDE 0.9 % IV BOLUS
1500.0000 mL | Freq: Once | INTRAVENOUS | Status: AC
  Administered 2018-03-08: 1500 mL via INTRAVENOUS

## 2018-03-08 MED ORDER — INSULIN ASPART 100 UNIT/ML ~~LOC~~ SOLN
5.0000 [IU] | Freq: Once | SUBCUTANEOUS | Status: AC
  Administered 2018-03-08: 5 [IU] via INTRAVENOUS
  Filled 2018-03-08: qty 1

## 2018-03-08 MED ORDER — FINASTERIDE 5 MG PO TABS
5.0000 mg | ORAL_TABLET | Freq: Every day | ORAL | Status: DC
  Administered 2018-03-08 – 2018-03-10 (×3): 5 mg via ORAL
  Filled 2018-03-08 (×4): qty 1

## 2018-03-08 MED ORDER — TAMSULOSIN HCL 0.4 MG PO CAPS
0.4000 mg | ORAL_CAPSULE | Freq: Every day | ORAL | Status: DC
  Administered 2018-03-08 – 2018-03-10 (×3): 0.4 mg via ORAL
  Filled 2018-03-08 (×3): qty 1

## 2018-03-08 MED ORDER — IOHEXOL 300 MG/ML  SOLN
100.0000 mL | Freq: Once | INTRAMUSCULAR | Status: AC | PRN
  Administered 2018-03-08: 100 mL via INTRAVENOUS

## 2018-03-08 MED ORDER — INSULIN ASPART 100 UNIT/ML IV SOLN: 6.0000 [IU] | Freq: Once | INTRAVENOUS | Status: DC

## 2018-03-08 MED ORDER — ATORVASTATIN CALCIUM 80 MG PO TABS
80.0000 mg | ORAL_TABLET | Freq: Every day | ORAL | Status: DC
  Administered 2018-03-08 – 2018-03-10 (×3): 80 mg via ORAL
  Filled 2018-03-08 (×3): qty 1

## 2018-03-08 MED ORDER — MORPHINE SULFATE (PF) 4 MG/ML IV SOLN
4.0000 mg | Freq: Once | INTRAVENOUS | Status: AC
  Administered 2018-03-08: 4 mg via INTRAVENOUS
  Filled 2018-03-08: qty 1

## 2018-03-08 MED ORDER — SODIUM CHLORIDE 0.9 % IV SOLN
1.0000 g | INTRAVENOUS | Status: DC
  Administered 2018-03-09 – 2018-03-10 (×2): 1 g via INTRAVENOUS
  Filled 2018-03-08: qty 10
  Filled 2018-03-08: qty 1
  Filled 2018-03-08: qty 10

## 2018-03-08 MED ORDER — CEFTRIAXONE SODIUM 2 G IJ SOLR
2.0000 g | Freq: Once | INTRAMUSCULAR | Status: AC
  Administered 2018-03-08: 2 g via INTRAVENOUS
  Filled 2018-03-08: qty 20

## 2018-03-08 MED ORDER — HYDRALAZINE HCL 20 MG/ML IJ SOLN: 5.0000 mg | INTRAMUSCULAR | Status: DC | PRN

## 2018-03-08 MED ORDER — ZOLPIDEM TARTRATE 5 MG PO TABS: 5.0000 mg | ORAL_TABLET | Freq: Every evening | ORAL | Status: DC | PRN

## 2018-03-08 MED ORDER — SODIUM CHLORIDE 0.9 % IV BOLUS
1000.0000 mL | Freq: Once | INTRAVENOUS | Status: AC
  Administered 2018-03-08: 1000 mL via INTRAVENOUS

## 2018-03-08 NOTE — ED Provider Notes (Signed)
Patient placed in Quick Look pathway, seen and evaluated   Chief Complaint: Abdominal pain  HPI:   Patient reports 2-day history of constant severe periumbilical abdominal pain.  Pain is constant.  He has had 3 episodes of nonbloody nonbilious emesis.  States emesis was red but attributes this to eating tomato juice.  Also notes watery red stools today but does not think they were bloody.  Denies fevers or chills.  He notes dysuria, hematuria, frequency, and urgency.  ROS: Positive for abdominal pain, nausea, vomiting, diarrhea, urinary symptoms Negative for fevers or chills  Physical Exam:   Gen: Appears uncomfortable  Neuro: Awake and Alert  Skin: Warm    Focused Exam: There is tenderness to palpation of the abdomen, worst in the epigastric, periumbilical, and left lower quadrant.  Murphy sign absent, Rovsing's absent, no CVA tenderness.  Active bowel sounds in all 4 quadrants   Initiation of care has begun. The patient has been counseled on the process, plan, and necessity for staying for the completion/evaluation, and the remainder of the medical screening examination    Renita Papa, PA-C 03/08/18 Martin, Wenda Overland, MD 03/09/18 [phone]

## 2018-03-08 NOTE — H&P (Signed)
History and Physical    Manuel [name]:[id number] DOB: 30-Mar-1956 DOA: 03/08/2018  Referring MD/NP/PA:   PCP: Glendale Chard, MD   Patient coming from:  The patient is coming from home.  At baseline, pt is independent for most of ADL.   Chief Complaint: Dysuria, burning on urination, increased urinary frequency, abdominal pain, nausea, vomiting  HPI: Manuel [name] is a 62 y.o. male with medical history significant of hypertension, hyperlipidemia, diabetes mellitus, BPH, CKD 3, who presents with dysuria, burning on urination, increased urinary frequency, abdominal pain, nausea, vomiting.  Patient states that he has been having abdominal pain in the past 2 days, today he developed nausea and vomiting.  He has had 3 episodes of nonbloody non-biliary vomiting.  He has loose stool, but no obvious diarrhea.  Patient states that his abdominal pain is located in periumbilical area, constant, moderate, nonradiating.  Patient also reports dysuria, burning on urination, and increased urinary frequency in the past several days.  No hematuria.  He states that he has difficult urinating particularly in the night due to BPH.  ED Course: pt was found to have positive urinalysis with moderate pneumonia leukocyte, WBC 50, many bacteria and hazy appearance, no ketones in urinalysis.  Blood sugar 535, bicarbonate 24, anion gap 17, WBC 20.6, lipase 37, worsening renal function, temperature normal, tachycardia, no tachypnea, oxygen saturation 100% on room air.  CT abdomen/pelvis showed 1. Bladder distension with bilateral hydronephrosis with extensive perinephric edema and fluid. There is fluid tracking down the abdomen into the right lower quadrant. Findings are suggestive for bladder outlet obstruction or urinary retention. 2. Prostate enlargement. 3. 1.1 cm low-density structure in the spleen is likely an incidental finding.  Review of Systems:   General: no fevers, chills, no body weight gain, has  fatigue HEENT: no blurry vision, hearing changes or sore throat Respiratory: no dyspnea, coughing, wheezing CV: no chest pain, no palpitations GI: has nausea, vomiting, abdominal pain, no diarrhea, constipation GU: has dysuria, burning on urination, increased urinary frequency, no hematuria  Ext: no leg edema Neuro: no unilateral weakness, numbness, or tingling, no vision change or hearing loss Skin: no rash, no skin tear. MSK: No muscle spasm, no deformity, no limitation of range of movement in spin Heme: No easy bruising.  Travel history: No recent long distant travel.  Allergy: No Known Allergies  Past Medical History:  Diagnosis Date  . BPH (benign prostatic hyperplasia)   . Diabetes mellitus without complication ([place])   . Hyperlipidemia   . Hypertension     Past Surgical History:  Procedure Laterality Date  . CARDIAC CATHETERIZATION N/A 03/23/2016   Procedure: Left Heart Cath and Coronary Angiography;  Surgeon: Nelva Bush, MD;  Location: [place] [facility];  Service: Cardiovascular;  Laterality: N/A;    Social History:  reports that he has never smoked. He has never used smokeless tobacco. He reports that he does not drink alcohol or use drugs.  Family History:  Family History  Problem Relation Age of Onset  . Scleroderma Mother 39  . Congestive Heart Failure Father 33     Prior to Admission medications   Medication Sig Start Date End Date Taking? Authorizing Provider  aspirin 81 MG chewable tablet Chew 1 tablet (81 mg total) by mouth daily. 03/24/16   Rai, Vernelle Emerald, MD  atorvastatin (LIPITOR) 80 MG tablet Take 1 tablet (80 mg total) by mouth at bedtime. 03/24/16   Rai, Ripudeep K, MD  glimepiride (AMARYL) 4 MG tablet Take  1 tablet (4 mg total) by mouth daily with breakfast. 03/24/16   Rai, Vernelle Emerald, MD  metFORMIN (GLUCOPHAGE) 1000 MG tablet Take 1 tablet (1,000 mg total) by mouth 2 (two) times daily with a meal. 03/25/16   Rai, Ripudeep K, MD  tamsulosin (FLOMAX)  0.4 MG CAPS capsule Take 0.4 mg by mouth daily.    [provider]    Physical Exam: Vitals:   03/08/18 1630 03/08/18 2030 03/08/18 2115  BP: (!) 178/88 (!) 146/88 (!) 152/81  Pulse: (!) 102 87 81  Resp: 18 19 17   Temp: 98.5 F (36.9 C)    TempSrc: Oral    SpO2: 100% 97% 99%  Weight: 86.2 kg (190 lb)    Height: 5\' 7"  (1.702 m)     General: Not in acute distress HEENT:       Eyes: PERRL, EOMI, no scleral icterus.       ENT: No discharge from the ears and nose, no pharynx injection, no tonsillar enlargement.        Neck: No JVD, no bruit, no mass felt. Heme: No neck lymph node enlargement. Cardiac: S1/S2, RRR, No murmurs, No gallops or rubs. Respiratory:  No rales, wheezing, rhonchi or rubs. GI: Soft, nondistended, has tenderness in suprapubic and periumbilical area, no rebound pain, no organomegaly, BS present. GU: No hematuria Ext: No pitting leg edema bilaterally. 2+DP/PT pulse bilaterally. Musculoskeletal: No joint deformities, No joint redness or warmth, no limitation of ROM in spin. Skin: No rashes.  Neuro: Alert, oriented X3, cranial nerves II-XII grossly intact, moves all extremities normally Psych: Patient is not psychotic, no suicidal or hemocidal ideation.  Labs on Admission: I have personally reviewed following labs and imaging studies  CBC: Recent Labs  Lab 03/08/18 1637  WBC 20.6*  HGB 12.4*  HCT 37.7*  MCV 89.8  PLT 735   Basic Metabolic Panel: Recent Labs  Lab 03/08/18 1637  NA 134*  K 3.9  CL 93*  CO2 24  GLUCOSE 535*  BUN 16  CREATININE 2.06*  CALCIUM 8.7*   GFR: Estimated Creatinine Clearance: 38.5 mL/min (A) (by C-G formula based on SCr of 2.06 mg/dL (H)). Liver Function Tests: Recent Labs  Lab 03/08/18 1637  AST 31  ALT 29  ALKPHOS 104  BILITOT 0.8  PROT 7.1  ALBUMIN 3.7   Recent Labs  Lab 03/08/18 1637  LIPASE 37   No results for input(s): AMMONIA in the last 168 hours. Coagulation Profile: No results for  input(s): INR, PROTIME in the last 168 hours. Cardiac Enzymes: No results for input(s): CKTOTAL, CKMB, CKMBINDEX, TROPONINI in the last 168 hours. BNP (last 3 results) No results for input(s): PROBNP in the last 8760 hours. HbA1C: No results for input(s): HGBA1C in the last 72 hours. CBG: Recent Labs  Lab 03/08/18 1926 03/08/18 2116  GLUCAP 534* 405*   Lipid Profile: No results for input(s): CHOL, HDL, LDLCALC, TRIG, CHOLHDL, LDLDIRECT in the last 72 hours. Thyroid Function Tests: No results for input(s): TSH, T4TOTAL, FREET4, T3FREE, THYROIDAB in the last 72 hours. Anemia Panel: No results for input(s): VITAMINB12, FOLATE, FERRITIN, TIBC, IRON, RETICCTPCT in the last 72 hours. Urine analysis:    Component Value Date/Time   COLORURINE YELLOW 03/08/2018 1633   APPEARANCEUR HAZY (A) 03/08/2018 1633   LABSPEC 1.023 03/08/2018 1633   PHURINE 5.0 03/08/2018 1633   GLUCOSEU >=500 (A) 03/08/2018 1633   HGBUR SMALL (A) 03/08/2018 1633   BILIRUBINUR NEGATIVE 03/08/2018 1633   KETONESUR NEGATIVE 03/08/2018 1633  PROTEINUR NEGATIVE 03/08/2018 1633   NITRITE NEGATIVE 03/08/2018 1633   LEUKOCYTESUR MODERATE (A) 03/08/2018 1633   Sepsis Labs: @LABRCNTIP (procalcitonin:4,lacticidven:4) )No results found for this or any previous visit (from the past 240 hour(s)).   Radiological Exams on Admission: Ct Abdomen Pelvis W Contrast  Result Date: 03/08/2018 CLINICAL DATA:  62 year old with abdominal pain. Diverticulitis suspected. EXAM: CT ABDOMEN AND PELVIS WITH CONTRAST TECHNIQUE: Multidetector CT imaging of the abdomen and pelvis was performed using the standard protocol following bolus administration of intravenous contrast. CONTRAST:  160mL OMNIPAQUE IOHEXOL 300 MG/ML  SOLN COMPARISON:  None. FINDINGS: Lower chest: Prominent lung markings at both lung bases and findings may represent postinflammatory changes. No pleural effusions. Hepatobiliary: Normal appearance of the liver, gallbladder and  portal venous system. Pancreas: Unremarkable. No pancreatic ductal dilatation or surrounding inflammatory changes. Spleen: 1.1 cm low-density structure along the superior aspect of the spleen which is less conspicuous on the delayed images. This probably represents a benign etiology such as hemangioma. Adrenals/Urinary Tract: Normal appearance of the adrenal glands. Urinary bladder is distended and extends into the lower abdomen. There is moderate bilateral hydronephrosis and extensive perinephric edema and perinephric fluid bilaterally. There is fluid tracking into the right lower quadrant and anterior aspect of the periaortic region. Stomach/Bowel: Large amount of stool in the cecum which is located in the right upper abdomen. No evidence for bowel obstruction. Vascular/Lymphatic: Atherosclerotic calcifications in the abdominal aorta without aneurysm. There is no significant lymph node enlargement in the abdomen or pelvis. IVC and renal veins are patent. Reproductive: Prostate is prominent for size and extends into the base of the bladder. Prostate roughly measures 5.8 cm in the craniocaudal dimension. Other: Fluid tracking in the right lower quadrant which appears to be associated with the retroperitoneal and bilateral perinephric edema. Negative for free intraperitoneal air. Musculoskeletal: No acute bone abnormality. IMPRESSION: Bladder distension with bilateral hydronephrosis with extensive perinephric edema and fluid. There is fluid tracking down the abdomen into the right lower quadrant. Findings are suggestive for bladder outlet obstruction or urinary retention. Prostate enlargement. 1.1 cm low-density structure in the spleen is likely an incidental finding. Electronically Signed   By: Markus Daft M.D.   On: 03/08/2018 19:14     EKG: Independently reviewed.  Sinus rhythm, tachycardia, QTC 474, nonspecific T wave change.   Assessment/Plan Principal Problem:   Pyelonephritis Active Problems:   Type  II diabetes mellitus with renal manifestations (HCC)   Essential hypertension   Hyperlipidemia   BPH (benign prostatic hyperplasia)   Acute renal failure superimposed on stage 3 chronic kidney disease (HCC)   Sepsis (HCC)   Hydronephrosis, bilateral   Lesion of spleen  Sepsis due to pyelonephritis: pt meets criteria for sepsis with leukocytosis and tachycardia.  Pending lactic acid.  Currently hemodynamically stable.  Patient has positive urinalysis for infection, given nausea, vomiting and hydronephrosis, likely have pyelonephritis. - Place on med-surg bed for obs -  Ceftriaxone by IV - Follow up results of urine and blood cx and amend antibiotic regimen if needed per sensitivity results - prn Zofran for nausea - will get Procalcitonin and trend lactic acid levels per sepsis protocol. - IVF: 2.5L of NS bolus in ED, followed by 100 cc/h   Hydronephrosis, bilateral: this is likely due to bladder outlet obstruction and urinary retention 2/2 prostate enlargement -Foley cath was placed in ED -on flomax -will start proscar  Type II diabetes mellitus with renal manifestations ([place]): Last A1c 12.2 on 03/23/16, poorly controled. Patient is  taking metformin at home.  Blood sugar 535, anion gap 17, but bicarbonate 24 and no keton in UA, dose not have DKA. -will give 5 U of novolog by IV -start lantus 3 U daily -SSI -IVF as above  Essential hypertension: -pt is on Flomax which is for BPH -IV hydralazine as needed  Hyperlipidemia: -lipitor  BPH (benign prostatic hyperplasia): pt states that he is taking Flomax, but it is not very effective.  He still has difficulty urinating, particularly in the night. -Continue Flomax -Add Proscar -Advised the patient to follow-up with urologist.  Acute renal failure superimposed on stage 3 chronic kidney disease ([place]): Baseline creatinine 1.47 on 03/24/2016, he is creatinine is at 2.04, BUN 16, likely due to hydronephrosis 22 obstruction and urinary  retention. -Foley cath placed -f/u BMP  Lesion of spleen: CT scan incidentally showed 1.1 cm low-density structure along the superior aspect of the spleen which is less conspicuous on the delayed images. This probably represents a benign etiology such as hemangioma per radiologist -f/u with PCP -may need to give referral to oncologist for further evaluation treatment    DVT ppx: SQ Heparin  Code Status: Full code Family Communication:  Yes, patient's  wife  at bed side Disposition Plan:  Anticipate discharge back to previous home environment Consults called:  none Admission status: Obs / tele     Date of Service 03/08/2018    Ivor Costa [facility] Pager [phone]  If 7PM-7AM, please contact night-coverage [email or web address] Password [email or web address] 03/08/2018, 9:22 PM

## 2018-03-08 NOTE — ED Triage Notes (Addendum)
Pt presents with 2 day h/o umbilical abdominal pain.  Pt reports pain does not radiate and is constant.  Nausea and vomiting began today. Pt reports burning when he voids;  Reports sudden onset of weakness that began yesterday while at grocery store, reports continued weakness today.

## 2018-03-08 NOTE — Progress Notes (Signed)
Received report from ED RN. Room ready for patient. Terrin Meddaugh Joselita, RN 

## 2018-03-09 ENCOUNTER — Other Ambulatory Visit: Payer: Self-pay

## 2018-03-09 DIAGNOSIS — N401 Enlarged prostate with lower urinary tract symptoms: Secondary | ICD-10-CM

## 2018-03-09 DIAGNOSIS — D739 Disease of spleen, unspecified: Secondary | ICD-10-CM

## 2018-03-09 DIAGNOSIS — R338 Other retention of urine: Secondary | ICD-10-CM

## 2018-03-09 LAB — BASIC METABOLIC PANEL
ANION GAP: 10 (ref 5–15)
BUN: 14 mg/dL (ref 8–23)
CHLORIDE: 100 mmol/L (ref 98–111)
CO2: 29 mmol/L (ref 22–32)
Calcium: 7.6 mg/dL — ABNORMAL LOW (ref 8.9–10.3)
Creatinine, Ser: 1.79 mg/dL — ABNORMAL HIGH (ref 0.61–1.24)
GFR calc non Af Amer: 39 mL/min — ABNORMAL LOW (ref >60)
GFR, EST AFRICAN AMERICAN: 45 mL/min — AB (ref >60)
Glucose, Bld: 320 mg/dL — ABNORMAL HIGH (ref 70–99)
POTASSIUM: 3.6 mmol/L (ref 3.5–5.1)
SODIUM: 139 mmol/L (ref 135–145)

## 2018-03-09 LAB — CBC
HEMATOCRIT: 32.8 % — AB (ref 39.0–52.0)
Hemoglobin: 10.8 g/dL — ABNORMAL LOW (ref 13.0–17.0)
MCH: 29.6 pg (ref 26.0–34.0)
MCHC: 32.9 g/dL (ref 30.0–36.0)
MCV: 89.9 fL (ref 78.0–100.0)
PLATELETS: 221 10*3/uL (ref 150–400)
RBC: 3.65 MIL/uL — AB (ref 4.22–5.81)
RDW: 14.2 % (ref 11.5–15.5)
WBC: 17.5 10*3/uL — ABNORMAL HIGH (ref 4.0–10.5)

## 2018-03-09 LAB — GLUCOSE, CAPILLARY
GLUCOSE-CAPILLARY: 271 mg/dL — AB (ref 70–99)
Glucose-Capillary: 281 mg/dL — ABNORMAL HIGH (ref 70–99)
Glucose-Capillary: 136 mg/dL — ABNORMAL HIGH (ref 70–99)
Glucose-Capillary: 279 mg/dL — ABNORMAL HIGH (ref 70–99)

## 2018-03-09 LAB — HEMOGLOBIN A1C
HEMOGLOBIN A1C: 9.1 % — AB (ref 4.8–5.6)
MEAN PLASMA GLUCOSE: 214.47 mg/dL

## 2018-03-09 LAB — LACTIC ACID, PLASMA
Lactic Acid, Venous: 1.2 mmol/L (ref 0.5–1.9)
Lactic Acid, Venous: 2.1 mmol/L (ref 0.5–1.9)

## 2018-03-09 LAB — PROCALCITONIN: PROCALCITONIN: 0.55 ng/mL

## 2018-03-09 LAB — HIV ANTIBODY (ROUTINE TESTING W REFLEX): HIV Screen 4th Generation wRfx: NONREACTIVE

## 2018-03-09 MED ORDER — ACETAMINOPHEN 325 MG PO TABS
975.0000 mg | ORAL_TABLET | Freq: Once | ORAL | Status: AC
  Administered 2018-03-09: 975 mg via ORAL
  Filled 2018-03-09: qty 3

## 2018-03-09 MED ORDER — SODIUM CHLORIDE 0.9 % IV BOLUS
500.0000 mL | Freq: Once | INTRAVENOUS | Status: AC
  Administered 2018-03-09: 500 mL via INTRAVENOUS

## 2018-03-09 NOTE — Progress Notes (Signed)
`  CRITICAL VALUE ALERT  Critical Value:  Lactic acid 2.1  Date & Time Notied:  03/09/18 00:55  Provider Notified: K. Carrie Mew 03/09/18 0:100  Orders Received/Actions taken: Order received in epic,carried out

## 2018-03-09 NOTE — Progress Notes (Signed)
New Admission Note:   Arrival Method: Via stretcher from [facility] ED Mental Orientation: Alert and oriented x4 Telemetry: Box #4 Assessment: Completed Skin: See doc flowsheet IV: Rt FA Pain: Denies Tubes: Foley catheter Safety Measures: Safety Fall Prevention Plan has been given, discussed and signed Admission: Completed 5MW Orientation: Patient has been orientated to the room, unit and staff. Patient care guide given to patient. Family: None at bedside  Orders have been reviewed and implemented. Will continue to monitor the patient. Call light has been placed within reach and bed alarm has been activated.   Sharin Altidor [employer], RN-BC Phone number: [phone]

## 2018-03-09 NOTE — Progress Notes (Signed)
Inpatient Diabetes Program Recommendations  AACE/ADA: New Consensus Statement on Inpatient Glycemic Control (2015)  Target Ranges:  Prepandial:   less than 140 mg/dL      Peak postprandial:   less than 180 mg/dL (1-2 hours)      Critically ill patients:  140 - 180 mg/dL   Lab Results  Component Value Date   GLUCAP 281 (H) 03/09/2018   HGBA1C 12.2 (H) 03/23/2016    Review of Glycemic Control  Diabetes history: DM2 Outpatient Diabetes medications: Amaryl 4 mg + Metformin 1 gm qd Current orders for Inpatient glycemic control: Lantus 3 units + Novolog sensitive correction tid  Inpatient Diabetes Program Recommendations:   -Increase Lantus to 10 units daily -A1c to determine prehospital glycemic control  DM coordinator spoke with patient when admitted August 2017 and patient was not willing to begin insulin @ this time even though A1c was 12.2. Will follow and glad to assist as needed.  Thank you, Nani Gasser. Kyian Obst, RN, MSN, CDE  Diabetes Coordinator Inpatient Glycemic Control Team Team Pager [phone] (8am-5pm) 03/09/2018 9:15 AM

## 2018-03-09 NOTE — Progress Notes (Signed)
PROGRESS NOTE    Manuel [name]  QAS:[id number] DOB: [birth year] DOA: 03/08/2018 PCP: Glendale Chard, MD  Brief Narrative: Manuel [name] is a 61 y.o. male with medical history significant of hypertension, hyperlipidemia, diabetes mellitus, BPH, CKD 3, who presents with dysuria, burning on urination, increased urinary frequency, abdominal pain, nausea, vomiting. -found to have acute urinary retention with hydronephrosis and AKI, complicated by pyelonephritis and hyperglycemia  Assessment & Plan:   Sepsis due to pyelonephritis -Clinically improving, sepsis physiology has resolved -Continue IV ceftriaxone -continue IV fluids today -Follow blood and urine cultures  Acute urinary retention with bilateral hydronephrosis and acute kidney injury -long history of BPH, intermittent poor compliance with tamsulosin -Foley catheter placed in the ED 8/6, continue this for 7-10 days -Continue IV fluids, monitor kidney function daily -Continue Flomax, added Proscar -Urology follow-up in one week for voiding trial and Type II diabetes mellitus with renal manifestations ([place]): Last A1c 12.2 on 03/23/16, poorly controled. Patient is taking metformin at home.  Blood sugar 535, anion gap 17, but bicarbonate 24 and no keton in UA, dose not have DKA. -continue Lantus at low-dose, sliding scale insulin -Recheck hemoglobin A1c  Essential hypertension: -pt is on Flomax which is for BPH -IV hydralazine as needed  Hyperlipidemia: -lipitor  BPH (benign prostatic hyperplasia): pt states that he is taking Flomax, but it is not very effective.  He still has difficulty urinating, particularly in the night. -Continue Flomax -Add Proscar -Advised the patient to follow-up with urologist.  Lesion of spleen: CT scan incidentally showed 1.1 cm low-density structure along the superior aspect of the spleen which is less conspicuous on the delayed images. This probably represents a benign etiology such as hemangioma  per radiologist -f/u with PCP -may need to give referral to oncologist for further evaluation treatment    DVT ppx: SQ Heparin  Code Status: Full code Family Communication:  no family  at bed side Disposition Plan:  Anticipate discharge back to previous home environment  Consultants:      Procedures:   Antimicrobials:    Subjective: -feels better, no further nausea or vomiting  Objective: Vitals:   03/08/18 2318 03/08/18 2320 03/09/18 0449 03/09/18 0735  BP: (!) 150/80  133/71 124/67  Pulse: 83  94 90  Resp: 18  18 18   Temp: 98.3 F (36.8 C)  100.3 F (37.9 C) 99.4 F (37.4 C)  TempSrc: Oral  Oral Oral  SpO2: 99%  95% 95%  Weight:  85.2 kg (187 lb 13.3 oz)    Height:  5\' 7"  (1.702 m)      Intake/Output Summary (Last 24 hours) at 03/09/2018 1431 Last data filed at 03/09/2018 1332 Gross per 24 hour  Intake 2666.14 ml  Output 2175 ml  Net 491.14 ml   Filed Weights   03/08/18 1630 03/08/18 2320  Weight: 86.2 kg (190 lb) 85.2 kg (187 lb 13.3 oz)    Examination:  General exam: Appears calm and comfortable  Respiratory system: Clear to auscultation. Respiratory effort normal. Cardiovascular system: S1 & S2 heard, RRR. No JVD, murmurs, rubs, gallops Gastrointestinal system: Abdomen is nondistended, soft and nontender.Normal bowel sounds heard. Central nervous system: Alert and oriented. No focal neurological deficits. Extremities: Symmetric 5 x 5 power. Skin: No rashes, lesions or ulcers Psychiatry: Judgement and insight appear normal. Mood & affect appropriate.     Data Reviewed:   CBC: Recent Labs  Lab 03/08/18 1637 03/09/18 0420  WBC 20.6* 17.5*  HGB 12.4* 10.8*  HCT 37.7*  32.8*  MCV 89.8 89.9  PLT 225 119   Basic Metabolic Panel: Recent Labs  Lab 03/08/18 1637 03/09/18 0420  NA 134* 139  K 3.9 3.6  CL 93* 100  CO2 24 29  GLUCOSE 535* 320*  BUN 16 14  CREATININE 2.06* 1.79*  CALCIUM 8.7* 7.6*   GFR: Estimated Creatinine Clearance: 44  mL/min (A) (by C-G formula based on SCr of 1.79 mg/dL (H)). Liver Function Tests: Recent Labs  Lab 03/08/18 1637  AST 31  ALT 29  ALKPHOS 104  BILITOT 0.8  PROT 7.1  ALBUMIN 3.7   Recent Labs  Lab 03/08/18 1637  LIPASE 37   No results for input(s): AMMONIA in the last 168 hours. Coagulation Profile: No results for input(s): INR, PROTIME in the last 168 hours. Cardiac Enzymes: No results for input(s): CKTOTAL, CKMB, CKMBINDEX, TROPONINI in the last 168 hours. BNP (last 3 results) No results for input(s): PROBNP in the last 8760 hours. HbA1C: No results for input(s): HGBA1C in the last 72 hours. CBG: Recent Labs  Lab 03/08/18 2116 03/08/18 2251 03/08/18 2317 03/09/18 0734 03/09/18 1134  GLUCAP 405* 301* 333* 281* 136*   Lipid Profile: No results for input(s): CHOL, HDL, LDLCALC, TRIG, CHOLHDL, LDLDIRECT in the last 72 hours. Thyroid Function Tests: No results for input(s): TSH, T4TOTAL, FREET4, T3FREE, THYROIDAB in the last 72 hours. Anemia Panel: No results for input(s): VITAMINB12, FOLATE, FERRITIN, TIBC, IRON, RETICCTPCT in the last 72 hours. Urine analysis:    Component Value Date/Time   COLORURINE YELLOW 03/08/2018 1633   APPEARANCEUR HAZY (A) 03/08/2018 1633   LABSPEC 1.023 03/08/2018 1633   PHURINE 5.0 03/08/2018 1633   GLUCOSEU >=500 (A) 03/08/2018 1633   HGBUR SMALL (A) 03/08/2018 1633   BILIRUBINUR NEGATIVE 03/08/2018 1633   KETONESUR NEGATIVE 03/08/2018 1633   PROTEINUR NEGATIVE 03/08/2018 1633   NITRITE NEGATIVE 03/08/2018 1633   LEUKOCYTESUR MODERATE (A) 03/08/2018 1633   Sepsis Labs: @LABRCNTIP (procalcitonin:4,lacticidven:4)  ) Recent Results (from the past 240 hour(s))  Culture, blood (Routine X 2) w Reflex to ID Panel     Status: None (Preliminary result)   Collection Time: 03/08/18 11:36 PM  Result Value Ref Range Status   Specimen Description BLOOD LEFT ARM  Final   Special Requests   Final    BOTTLES DRAWN AEROBIC AND ANAEROBIC Blood  Culture adequate volume   Culture PENDING  Incomplete   Report Status PENDING  Incomplete  Culture, blood (Routine X 2) w Reflex to ID Panel     Status: None (Preliminary result)   Collection Time: 03/08/18 11:36 PM  Result Value Ref Range Status   Specimen Description BLOOD LEFT ARM  Final   Special Requests   Final    BOTTLES DRAWN AEROBIC AND ANAEROBIC Blood Culture adequate volume   Culture PENDING  Incomplete   Report Status PENDING  Incomplete         Radiology Studies: Ct Abdomen Pelvis W Contrast  Result Date: 03/08/2018 CLINICAL DATA:  62 year old with abdominal pain. Diverticulitis suspected. EXAM: CT ABDOMEN AND PELVIS WITH CONTRAST TECHNIQUE: Multidetector CT imaging of the abdomen and pelvis was performed using the standard protocol following bolus administration of intravenous contrast. CONTRAST:  152mL OMNIPAQUE IOHEXOL 300 MG/ML  SOLN COMPARISON:  None. FINDINGS: Lower chest: Prominent lung markings at both lung bases and findings may represent postinflammatory changes. No pleural effusions. Hepatobiliary: Normal appearance of the liver, gallbladder and portal venous system. Pancreas: Unremarkable. No pancreatic ductal dilatation or surrounding inflammatory changes. Spleen:  1.1 cm low-density structure along the superior aspect of the spleen which is less conspicuous on the delayed images. This probably represents a benign etiology such as hemangioma. Adrenals/Urinary Tract: Normal appearance of the adrenal glands. Urinary bladder is distended and extends into the lower abdomen. There is moderate bilateral hydronephrosis and extensive perinephric edema and perinephric fluid bilaterally. There is fluid tracking into the right lower quadrant and anterior aspect of the periaortic region. Stomach/Bowel: Large amount of stool in the cecum which is located in the right upper abdomen. No evidence for bowel obstruction. Vascular/Lymphatic: Atherosclerotic calcifications in the abdominal  aorta without aneurysm. There is no significant lymph node enlargement in the abdomen or pelvis. IVC and renal veins are patent. Reproductive: Prostate is prominent for size and extends into the base of the bladder. Prostate roughly measures 5.8 cm in the craniocaudal dimension. Other: Fluid tracking in the right lower quadrant which appears to be associated with the retroperitoneal and bilateral perinephric edema. Negative for free intraperitoneal air. Musculoskeletal: No acute bone abnormality. IMPRESSION: Bladder distension with bilateral hydronephrosis with extensive perinephric edema and fluid. There is fluid tracking down the abdomen into the right lower quadrant. Findings are suggestive for bladder outlet obstruction or urinary retention. Prostate enlargement. 1.1 cm low-density structure in the spleen is likely an incidental finding. Electronically Signed   By: Markus Daft M.D.   On: 03/08/2018 19:14        Scheduled Meds: . aspirin  81 mg Oral Daily  . atorvastatin  80 mg Oral QHS  . finasteride  5 mg Oral Daily  . heparin  5,000 Units Subcutaneous Q8H  . insulin aspart  0-9 Units Subcutaneous TID WC  . insulin glargine  3 Units Subcutaneous Daily  . tamsulosin  0.4 mg Oral Daily   Continuous Infusions: . sodium chloride 75 mL/hr at 03/08/18 2349  . cefTRIAXone (ROCEPHIN)  IV       LOS: 0 days    Time spent: 22min    Domenic Polite, MD [facility] Page via [email or web address], password [email or web address] After 7PM please contact night-coverage  03/09/2018, 2:31 PM

## 2018-03-10 DIAGNOSIS — R338 Other retention of urine: Secondary | ICD-10-CM | POA: Diagnosis not present

## 2018-03-10 DIAGNOSIS — N401 Enlarged prostate with lower urinary tract symptoms: Secondary | ICD-10-CM | POA: Diagnosis not present

## 2018-03-10 DIAGNOSIS — N390 Urinary tract infection, site not specified: Secondary | ICD-10-CM | POA: Diagnosis not present

## 2018-03-10 LAB — URINE CULTURE: CULTURE: NO GROWTH

## 2018-03-10 LAB — CBC
HCT: 32.5 % — ABNORMAL LOW (ref 39.0–52.0)
Hemoglobin: 10.7 g/dL — ABNORMAL LOW (ref 13.0–17.0)
MCH: 29.7 pg (ref 26.0–34.0)
MCHC: 32.9 g/dL (ref 30.0–36.0)
MCV: 90.3 fL (ref 78.0–100.0)
PLATELETS: 209 10*3/uL (ref 150–400)
RBC: 3.6 MIL/uL — AB (ref 4.22–5.81)
RDW: 14.5 % (ref 11.5–15.5)
WBC: 10.7 10*3/uL — ABNORMAL HIGH (ref 4.0–10.5)

## 2018-03-10 LAB — BASIC METABOLIC PANEL
Anion gap: 9 (ref 5–15)
BUN: 11 mg/dL (ref 8–23)
CO2: 26 mmol/L (ref 22–32)
CREATININE: 1.48 mg/dL — AB (ref 0.61–1.24)
Calcium: 7.8 mg/dL — ABNORMAL LOW (ref 8.9–10.3)
Chloride: 105 mmol/L (ref 98–111)
GFR calc Af Amer: 56 mL/min — ABNORMAL LOW (ref >60)
GFR, EST NON AFRICAN AMERICAN: 49 mL/min — AB (ref >60)
GLUCOSE: 218 mg/dL — AB (ref 70–99)
POTASSIUM: 3.7 mmol/L (ref 3.5–5.1)
Sodium: 140 mmol/L (ref 135–145)

## 2018-03-10 LAB — GLUCOSE, CAPILLARY
Glucose-Capillary: 214 mg/dL — ABNORMAL HIGH (ref 70–99)
Glucose-Capillary: 235 mg/dL — ABNORMAL HIGH (ref 70–99)
Glucose-Capillary: 246 mg/dL — ABNORMAL HIGH (ref 70–99)
Glucose-Capillary: 215 mg/dL — ABNORMAL HIGH (ref 70–99)

## 2018-03-10 MED ORDER — PHENAZOPYRIDINE HCL 100 MG PO TABS
100.0000 mg | ORAL_TABLET | Freq: Three times a day (TID) | ORAL | Status: DC
  Administered 2018-03-10 – 2018-03-11 (×5): 100 mg via ORAL
  Filled 2018-03-10 (×6): qty 1

## 2018-03-10 MED ORDER — HYDROCHLOROTHIAZIDE 25 MG PO TABS
25.0000 mg | ORAL_TABLET | Freq: Every day | ORAL | Status: DC
  Administered 2018-03-10 – 2018-03-11 (×2): 25 mg via ORAL
  Filled 2018-03-10 (×2): qty 1

## 2018-03-10 MED ORDER — BACITRACIN-NEOMYCIN-POLYMYXIN OINTMENT TUBE
TOPICAL_OINTMENT | Freq: Two times a day (BID) | CUTANEOUS | Status: DC
  Administered 2018-03-10: 22:00:00 via TOPICAL
  Filled 2018-03-10: qty 14

## 2018-03-10 MED ORDER — HYDRALAZINE HCL 20 MG/ML IJ SOLN
10.0000 mg | Freq: Four times a day (QID) | INTRAMUSCULAR | Status: DC | PRN
Start: 2018-03-10 — End: 2018-03-11

## 2018-03-10 MED ORDER — INSULIN GLARGINE 100 UNIT/ML ~~LOC~~ SOLN
5.0000 [IU] | Freq: Every day | SUBCUTANEOUS | Status: DC
  Filled 2018-03-10: qty 0.05

## 2018-03-10 NOTE — Evaluation (Signed)
Physical Therapy Evaluation Patient Details Name: Manuel [name] MRN: [id number] DOB: Mar 20, 1956 Today's Date: 03/10/2018   History of Present Illness  Pt is a 62 y.o. M with significant PMH of hypertension, hyperlipidemia, diabetes mellitus, BPH, CKD 3, who presents with dysuria, burning on urination, increased urinary frequency, abdominal pain, nausea, vomiting. Found to have acute urinary retention with hydronephrosis and AKI, complicated by pyelonephritis and hyperglycemia.   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 Currently main deficit is periumbilical pain/discomfort at rest and with movement. Initially hesitant about movement but reports no increase in pain with mobility. On PT evaluation, patient ambulating hallway distances with no assistive device modified independently. Does display decreased gait speed and gait abnormalities but likely secondary to pain/discomfort. Suspect that with pain management, patient will return to walking independently with a normal gait pattern. No further acute PT needs. Patient has no further questions/concerns and was pleased with his performance. PT signing off. Thank you for the consult.     Follow Up Recommendations No PT follow up    Equipment Recommendations  None recommended by PT    Recommendations for Other Services       Precautions / Restrictions Precautions Precautions: None Restrictions Weight Bearing Restrictions: No      Mobility  Bed Mobility Overal bed mobility: Needs Assistance Bed Mobility: Rolling;Sidelying to Sit Rolling: Modified independent (Device/Increase time) Sidelying to sit: Min assist       General bed mobility comments: Patient encouraged to roll onto side prior to sitting up to decrease abdominal pressure. Min assist provided to elevate trunk secondary to pain  Transfers Overall transfer level: Modified independent Equipment used: None             General  transfer comment: Increased time to achieve upright secondary to pain  Ambulation/Gait Ambulation/Gait assistance: Modified independent (Device/Increase time) Gait Distance (Feet): 400 Feet Assistive device: None Gait Pattern/deviations: Step-through pattern;Narrow base of support;Decreased stride length Gait velocity: decreased   General Gait Details: Patient with guarded gait and displaying decreased bilateral foot clearance. Cues provided for upright posture. Overall no unsteadiness or overt LOB noted.   Stairs            Wheelchair Mobility    Modified Rankin (Stroke Patients Only)       Balance Overall balance assessment: No apparent balance deficits (not formally assessed)                                           Pertinent Vitals/Pain Pain Assessment: Faces Faces Pain Scale: Hurts little more Pain Location: periumbilical Pain Descriptors / Indicators: Aching;Cramping Pain Intervention(s): Limited activity within patient's tolerance;Monitored during session    Home Living Family/patient expects to be discharged to:: Private residence Living Arrangements: Spouse/significant other Available Help at Discharge: Family Type of Home: House Home Access: Level entry     Home Layout: One level Home Equipment: None      Prior Function Level of Independence: Independent         Comments: Works as a Development worker, [employer]        Extremity/Trunk Assessment   Upper Extremity Assessment Upper Extremity Assessment: Overall WFL for tasks assessed    Lower Extremity Assessment Lower Extremity Assessment: Overall WFL for tasks assessed    Cervical / Trunk Assessment Cervical / Trunk Assessment: Normal  Communication  Communication: No difficulties  Cognition Arousal/Alertness: Awake/alert Behavior During Therapy: WFL for tasks assessed/performed Overall Cognitive Status: Within Functional Limits for tasks assessed                                         General Comments      Exercises     Assessment/Plan    PT Assessment Patent does not need any further PT services  PT Problem List Decreased mobility;Pain       PT Treatment Interventions      PT Goals (Current goals can be found in the Care Plan section)  Acute Rehab PT Goals Patient Stated Goal: "decrease pain with analgesics" PT Goal Formulation: All assessment and education complete, DC therapy    Frequency     Barriers to discharge        Co-evaluation               AM-PAC PT "6 Clicks" Daily Activity  Outcome Measure Difficulty turning over in bed (including adjusting bedclothes, sheets and blankets)?: A Little Difficulty moving from lying on back to sitting on the side of the bed? : Unable Difficulty sitting down on and standing up from a chair with arms (e.g., wheelchair, bedside commode, etc,.)?: A Little Help needed moving to and from a bed to chair (including a wheelchair)?: None Help needed walking in hospital room?: None Help needed climbing 3-5 steps with a railing? : A Little 6 Click Score: 18    End of Session Equipment Utilized During Treatment: Gait belt Activity Tolerance: Patient tolerated treatment well Patient left: in chair;with call bell/phone within reach Nurse Communication: Mobility status PT Visit Diagnosis: Other abnormalities of gait and mobility (R26.89);Pain Pain - part of body: (periumbilical)    Time: 9147-8295 PT Time Calculation (min) (ACUTE ONLY): 35 min   Charges:   PT Evaluation $PT Eval Low Complexity: 1 Low PT Treatments $Therapeutic Activity: 8-22 mins        Ellamae Sia, PT, DPT Acute Rehabilitation Services  Pager: [place] 03/10/2018, 11:45 AM

## 2018-03-10 NOTE — Consult Note (Signed)
Urology Consult  CC: Referring physician: Katrine Coho, MD Reason for referral: Urinary retention  Impression/Assessment: 1.  Acute urinary retention: He appears to have developed acute urinary retention secondary to UTI.  He has underlying BPH with outlet obstruction that has been managed with tamsulosin.  We discussed increasing his tamsulosin dosage and the fact that finasteride takes 3-6 months for his full effects to be seen and he was not in favor of continuing this medication.  We discussed a voiding trial tomorrow with the possible risk of redeveloping retention and requiring a Foley catheter to be reinserted versus continuing to have his Foley catheter indwelling upon discharge with a voiding trial in my office and he is in favor of this.  2.  Urinary tract infection.  He appeared to have infected urine upon admission.  Urine culture has been performed although he had no growth despite the fact that there were many bacteria seen on his urinalysis and greater than 50 white blood cells.  He has responded to antibiotic therapy.  I would recommending continuing antibiotics upon discharge due to the possibility of prosthetic infection and to improve the probability of spontaneous voiding when his catheter is removed.   Plan:  1.  Increase dosage of tamsulosin to 0.8 mg at bedtime and discharge him on this dosage. 2.  Discontinue finasteride. 3.  Would recommend antibiotics upon discharge.  I would favor Cipro 500 mg twice daily for 1 week. 4.  He will be discharged with his Foley catheter indwelling with follow-up at my office next week for a voiding trial. 5.  I have recommended Neosporin to the meatus for his catheter discomfort.   History of Present Illness: Manuel [name] is a 62 year old male who is seen in hospital consultation today for further evaluation of urinary retention.  He has a history of BPH and has been taking tamsulosin for proximally 1 year.  He was not having any  difficulty until about 2 days prior to his admission when he began to have a significant amount of difficulty urinating.  He said he had to push and would only void a very small amount.  This then became associated with some dysuria.  He does not have a history of recurrent infections.  He then progressed to develop severe abdominal pain and presented to the emergency room where a Foley catheter was inserted and this resulted in immediate relief of his pain.  His urine appeared infected and he was placed on antibiotic therapy.  A CT scan was obtained that revealed bilateral hydronephrosis with fluid collecting around the right kidney suggestive of a probable forniceal rupture.  Past Medical History:  Diagnosis Date  . BPH (benign prostatic hyperplasia)   . Diabetes mellitus without complication ([place])   . Hyperlipidemia   . Hypertension    Past Surgical History:  Procedure Laterality Date  . CARDIAC CATHETERIZATION N/A 03/23/2016   Procedure: Left Heart Cath and Coronary Angiography;  Surgeon: Nelva Bush, MD;  Location: [facility];  Service: Cardiovascular;  Laterality: N/A;    Medications:  Scheduled: . aspirin  81 mg Oral Daily  . atorvastatin  80 mg Oral QHS  . finasteride  5 mg Oral Daily  . heparin  5,000 Units Subcutaneous Q8H  . hydrochlorothiazide  25 mg Oral Daily  . insulin aspart  0-9 Units Subcutaneous TID WC  . [START ON 03/11/2018] insulin glargine  5 Units Subcutaneous Daily  . phenazopyridine  100 mg Oral TID WC  .  tamsulosin  0.4 mg Oral Daily   Continuous: . cefTRIAXone (ROCEPHIN)  IV 1 g (03/09/18 2207)    Allergies: No Known Allergies  Family History  Problem Relation Age of Onset  . Scleroderma Mother 71  . Congestive Heart Failure Father 11    Social History:  reports that he has never smoked. He has never used smokeless tobacco. He reports that he does not drink alcohol or use drugs.  Review of Systems (10 point): Pertinent items are noted in  HPI. A comprehensive review of systems was negative except as noted above.  Physical Exam:  Vital signs in last 24 hours: Temp:  [98.7 F (37.1 C)-99.5 F (37.5 C)] 98.7 F (37.1 C) (08/08 0836) Pulse Rate:  [55-77] 77 (08/08 0836) Resp:  [18] 18 (08/08 0836) BP: (138-167)/(75-96) 167/96 (08/08 0836) SpO2:  [97 %-98 %] 98 % (08/08 0836) General appearance: alert and appears stated age Head: Normocephalic, without obvious abnormality, atraumatic Eyes: conjunctivae/corneas clear. EOM's intact.  Oropharynx: moist mucous membranes Neck: supple, symmetrical, trachea midline Resp: normal respiratory effort Cardio: regular rate and rhythm Back: symmetric, no curvature. ROM normal. No CVA tenderness. GI: soft, non-tender; bowel sounds normal; no masses,  no organomegaly Male genitalia: penis: normal male phallus with no lesions or discharge. Foley catheter in place draining clear urine.  Testes: bilaterally descended with no masses or tenderness. no hernias Extremities: extremities normal, atraumatic, no cyanosis or edema Skin: Skin color normal. No visible rashes or lesions Neurologic: Grossly normal  Laboratory Data:  Recent Labs    03/08/18 1637 03/09/18 0420 03/10/18 0604  WBC 20.6* 17.5* 10.7*  HGB 12.4* 10.8* 10.7*  HCT 37.7* 32.8* 32.5*   BMET Recent Labs    03/09/18 0420 03/10/18 0604  NA 139 140  K 3.6 3.7  CL 100 105  CO2 29 26  GLUCOSE 320* 218*  BUN 14 11  CREATININE 1.79* 1.48*  CALCIUM 7.6* 7.8*   No results for input(s): LABPT, INR in the last 72 hours. No results for input(s): LABURIN in the last 72 hours. Results for orders placed or performed during the hospital encounter of 03/08/18  Urine Culture     Status: None   Collection Time: 03/08/18 10:46 PM  Result Value Ref Range Status   Specimen Description URINE, RANDOM  Final   Special Requests NONE  Final   Culture   Final    NO GROWTH Performed at [facility], [address],  [place], [place] [address]    Report Status 03/10/2018 FINAL  Final  Culture, blood (Routine X 2) w Reflex to ID Panel     Status: None (Preliminary result)   Collection Time: 03/08/18 11:36 PM  Result Value Ref Range Status   Specimen Description BLOOD LEFT ARM  Final   Special Requests   Final    BOTTLES DRAWN AEROBIC AND ANAEROBIC Blood Culture adequate volume   Culture   Final    NO GROWTH 1 DAY Performed at [facility], [place] [address] [place], [place] [address]    Report Status PENDING  Incomplete  Culture, blood (Routine X 2) w Reflex to ID Panel     Status: None (Preliminary result)   Collection Time: 03/08/18 11:36 PM  Result Value Ref Range Status   Specimen Description BLOOD LEFT ARM  Final   Special Requests   Final    BOTTLES DRAWN AEROBIC AND ANAEROBIC Blood Culture adequate volume   Culture   Final  NO GROWTH 1 DAY Performed at [facility], [place] [address], [place], [place] [address]    Report Status PENDING  Incomplete   Creatinine: Recent Labs    03/08/18 1637 03/09/18 0420 03/10/18 0604  CREATININE 2.06* 1.79* 1.48*    Imaging: Ct Abdomen Pelvis W Contrast  Result Date: 03/08/2018 CLINICAL DATA:  62 year old with abdominal pain. Diverticulitis suspected. EXAM: CT ABDOMEN AND PELVIS WITH CONTRAST TECHNIQUE: Multidetector CT imaging of the abdomen and pelvis was performed using the standard protocol following bolus administration of intravenous contrast. CONTRAST:  127mL OMNIPAQUE IOHEXOL 300 MG/ML  SOLN COMPARISON:  None. FINDINGS: Lower chest: Prominent lung markings at both lung bases and findings may represent postinflammatory changes. No pleural effusions. Hepatobiliary: Normal appearance of the liver, gallbladder and portal venous system. Pancreas: Unremarkable. No pancreatic ductal dilatation or surrounding inflammatory changes. Spleen: 1.1 cm low-density structure along the superior aspect of the spleen which is less conspicuous on the delayed  images. This probably represents a benign etiology such as hemangioma. Adrenals/Urinary Tract: Normal appearance of the adrenal glands. Urinary bladder is distended and extends into the lower abdomen. There is moderate bilateral hydronephrosis and extensive perinephric edema and perinephric fluid bilaterally. There is fluid tracking into the right lower quadrant and anterior aspect of the periaortic region. Stomach/Bowel: Large amount of stool in the cecum which is located in the right upper abdomen. No evidence for bowel obstruction. Vascular/Lymphatic: Atherosclerotic calcifications in the abdominal aorta without aneurysm. There is no significant lymph node enlargement in the abdomen or pelvis. IVC and renal veins are patent. Reproductive: Prostate is prominent for size and extends into the base of the bladder. Prostate roughly measures 5.8 cm in the craniocaudal dimension. Other: Fluid tracking in the right lower quadrant which appears to be associated with the retroperitoneal and bilateral perinephric edema. Negative for free intraperitoneal air. Musculoskeletal: No acute bone abnormality. IMPRESSION: Bladder distension with bilateral hydronephrosis with extensive perinephric edema and fluid. There is fluid tracking down the abdomen into the right lower quadrant. Findings are suggestive for bladder outlet obstruction or urinary retention. Prostate enlargement. 1.1 cm low-density structure in the spleen is likely an incidental finding. Electronically Signed   By: Markus Daft M.D.   On: 03/08/2018 19:14    CT scan images were independently reviewed.   Claybon Jabs 03/10/2018, 6:32 PM

## 2018-03-10 NOTE — Progress Notes (Signed)
Patient's BP 161/81 HR 104. Per patient,he takes Hydrochlorothiazide 25 mg daily and Olmesartan Medoxomil 40 mg daily. Text paged K. Carrie Mew

## 2018-03-10 NOTE — Progress Notes (Signed)
PROGRESS NOTE    Manuel [name]  NGE:[id number] DOB: 1956/07/07 DOA: 03/08/2018 PCP: Glendale Chard, MD  Brief Narrative: Manuel [name] is a 62 y.o. male with medical history significant of hypertension, hyperlipidemia, diabetes mellitus, BPH, CKD 3, who presents with dysuria, burning on urination, increased urinary frequency, abdominal pain, nausea, vomiting. -found to have acute urinary retention with bilateral hydronephrosis and AKI, complicated by pyelonephritis and hyperglycemia -Foley Placed in ED, started IVF and Abx-now improving  Assessment & Plan:   Sepsis due to pyelonephritis -was febrile on admission with leukocytosis of 20 K and abnormal urinalysis -Clinically improving, sepsis physiology has resolved -on day 2 of IV ceftriaxone, urine culture has returned negative -stop IVF, could consider stopping ceftriaxone after third dose in light of negative urine cultures  Acute urinary retention with bilateral hydronephrosis and acute kidney injury -long history of BPH, intermittent poor compliance with tamsulosin -Foley catheter placed in the ED 8/6, continue this for 7-10 days -creatinine improving and down to baseline, will stop IV fluids today -Continue Flomax, added Proscar -needs urology follow-up and voiding trial in 1 week, due to continued patient request we'll see if urology is able to see him and patient. -Due to significant bladder discomfort from Foley catheter will try phenazopyridine today  Type II diabetes mellitus with renal manifestations ([place]): Last A1c 12.2 on 03/23/16, poorly controled. Patient is taking metformin at home.  Blood sugar 535, anion gap 17, but bicarbonate 24 and no ketones in UA on admission -continue Lantus at low-dose, sliding scale insulin -Repeat hemoglobin A1c is 9, resume metformin at DC since AKI better, will discuss adding Glipizide  Essential hypertension: -resume HCTZ -ARB on hold  Hyperlipidemia: -lipitor  BPH (benign prostatic  hyperplasia): -as above  Lesion of spleen: CT scan incidentally showed 1.1 cm low-density structure along the superior aspect of the spleen which is less conspicuous on the delayed images. This probably represents a benign etiology such as hemangioma per radiologist -f/u with PCP -may need to give referral to oncologist for further evaluation treatment    DVT ppx: SQ Heparin  Code Status: Full code Family Communication:  no family  at bed side Disposition Plan: Home tomorrow if stable  Consultants:   Urology   Procedures:   Antimicrobials:  -ceftriaxone  Subjective: -catheter causing significant bladder discomfort  Objective: Vitals:   03/10/18 0457 03/10/18 0634 03/10/18 0636 03/10/18 0836  BP: (!) 161/81 (!) 161/84 (!) 149/75 (!) 167/96  Pulse: 63 (!) 55 (!) 55 77  Resp: 18   18  Temp: 99.1 F (37.3 C)   98.7 F (37.1 C)  TempSrc: Oral   Oral  SpO2: 97%   98%  Weight:      Height:        Intake/Output Summary (Last 24 hours) at 03/10/2018 1232 Last data filed at 03/10/2018 1100 Gross per 24 hour  Intake 2492.65 ml  Output 1175 ml  Net 1317.65 ml   Filed Weights   03/08/18 1630 03/08/18 2320  Weight: 86.2 kg 85.2 kg    Examination:  Gen: Awake, Alert, Oriented X 3, no distress HEENT: PERRLA, Neck supple, no JVD Lungs: clear bilaterally CVS: RRR,No Gallops,Rubs or new Murmurs Abd: soft, Non tender, non distended, BS present Extremities: No Cyanosis, Clubbing or edema Skin: no new rashes Psychiatry: Judgement and insight appear normal. Mood & affect appropriate.     Data Reviewed:   CBC: Recent Labs  Lab 03/08/18 1637 03/09/18 0420 03/10/18 0604  WBC 20.6* 17.5* 10.7*  HGB 12.4* 10.8* 10.7*  HCT 37.7* 32.8* 32.5*  MCV 89.8 89.9 90.3  PLT 225 221 761   Basic Metabolic Panel: Recent Labs  Lab 03/08/18 1637 03/09/18 0420 03/10/18 0604  NA 134* 139 140  K 3.9 3.6 3.7  CL 93* 100 105  CO2 24 29 26   GLUCOSE 535* 320* 218*  BUN 16 14  11   CREATININE 2.06* 1.79* 1.48*  CALCIUM 8.7* 7.6* 7.8*   GFR: Estimated Creatinine Clearance: 53.3 mL/min (A) (by C-G formula based on SCr of 1.48 mg/dL (H)). Liver Function Tests: Recent Labs  Lab 03/08/18 1637  AST 31  ALT 29  ALKPHOS 104  BILITOT 0.8  PROT 7.1  ALBUMIN 3.7   Recent Labs  Lab 03/08/18 1637  LIPASE 37   No results for input(s): AMMONIA in the last 168 hours. Coagulation Profile: No results for input(s): INR, PROTIME in the last 168 hours. Cardiac Enzymes: No results for input(s): CKTOTAL, CKMB, CKMBINDEX, TROPONINI in the last 168 hours. BNP (last 3 results) No results for input(s): PROBNP in the last 8760 hours. HbA1C: Recent Labs    03/09/18 0420  HGBA1C 9.1*   CBG: Recent Labs  Lab 03/09/18 1134 03/09/18 1649 03/09/18 2109 03/10/18 0731 03/10/18 1146  GLUCAP 136* 279* 271* 214* 235*   Lipid Profile: No results for input(s): CHOL, HDL, LDLCALC, TRIG, CHOLHDL, LDLDIRECT in the last 72 hours. Thyroid Function Tests: No results for input(s): TSH, T4TOTAL, FREET4, T3FREE, THYROIDAB in the last 72 hours. Anemia Panel: No results for input(s): VITAMINB12, FOLATE, FERRITIN, TIBC, IRON, RETICCTPCT in the last 72 hours. Urine analysis:    Component Value Date/Time   COLORURINE YELLOW 03/08/2018 1633   APPEARANCEUR HAZY (A) 03/08/2018 1633   LABSPEC 1.023 03/08/2018 1633   PHURINE 5.0 03/08/2018 1633   GLUCOSEU >=500 (A) 03/08/2018 1633   HGBUR SMALL (A) 03/08/2018 1633   BILIRUBINUR NEGATIVE 03/08/2018 1633   KETONESUR NEGATIVE 03/08/2018 1633   PROTEINUR NEGATIVE 03/08/2018 1633   NITRITE NEGATIVE 03/08/2018 1633   LEUKOCYTESUR MODERATE (A) 03/08/2018 1633   Sepsis Labs: @LABRCNTIP (procalcitonin:4,lacticidven:4)  ) Recent Results (from the past 240 hour(s))  Urine Culture     Status: None   Collection Time: 03/08/18 10:46 PM  Result Value Ref Range Status   Specimen Description URINE, RANDOM  Final   Special Requests NONE  Final    Culture   Final    NO GROWTH Performed at [facility], [place] [address], [place], [place] [address]    Report Status 03/10/2018 FINAL  Final  Culture, blood (Routine X 2) w Reflex to ID Panel     Status: None (Preliminary result)   Collection Time: 03/08/18 11:36 PM  Result Value Ref Range Status   Specimen Description BLOOD LEFT ARM  Final   Special Requests   Final    BOTTLES DRAWN AEROBIC AND ANAEROBIC Blood Culture adequate volume   Culture PENDING  Incomplete   Report Status PENDING  Incomplete  Culture, blood (Routine X 2) w Reflex to ID Panel     Status: None (Preliminary result)   Collection Time: 03/08/18 11:36 PM  Result Value Ref Range Status   Specimen Description BLOOD LEFT ARM  Final   Special Requests   Final    BOTTLES DRAWN AEROBIC AND ANAEROBIC Blood Culture adequate volume   Culture PENDING  Incomplete   Report Status PENDING  Incomplete         Radiology Studies: Ct Abdomen Pelvis W Contrast  Result  Date: 03/08/2018 CLINICAL DATA:  62 year old with abdominal pain. Diverticulitis suspected. EXAM: CT ABDOMEN AND PELVIS WITH CONTRAST TECHNIQUE: Multidetector CT imaging of the abdomen and pelvis was performed using the standard protocol following bolus administration of intravenous contrast. CONTRAST:  159mL OMNIPAQUE IOHEXOL 300 MG/ML  SOLN COMPARISON:  None. FINDINGS: Lower chest: Prominent lung markings at both lung bases and findings may represent postinflammatory changes. No pleural effusions. Hepatobiliary: Normal appearance of the liver, gallbladder and portal venous system. Pancreas: Unremarkable. No pancreatic ductal dilatation or surrounding inflammatory changes. Spleen: 1.1 cm low-density structure along the superior aspect of the spleen which is less conspicuous on the delayed images. This probably represents a benign etiology such as hemangioma. Adrenals/Urinary Tract: Normal appearance of the adrenal glands. Urinary bladder is distended and extends  into the lower abdomen. There is moderate bilateral hydronephrosis and extensive perinephric edema and perinephric fluid bilaterally. There is fluid tracking into the right lower quadrant and anterior aspect of the periaortic region. Stomach/Bowel: Large amount of stool in the cecum which is located in the right upper abdomen. No evidence for bowel obstruction. Vascular/Lymphatic: Atherosclerotic calcifications in the abdominal aorta without aneurysm. There is no significant lymph node enlargement in the abdomen or pelvis. IVC and renal veins are patent. Reproductive: Prostate is prominent for size and extends into the base of the bladder. Prostate roughly measures 5.8 cm in the craniocaudal dimension. Other: Fluid tracking in the right lower quadrant which appears to be associated with the retroperitoneal and bilateral perinephric edema. Negative for free intraperitoneal air. Musculoskeletal: No acute bone abnormality. IMPRESSION: Bladder distension with bilateral hydronephrosis with extensive perinephric edema and fluid. There is fluid tracking down the abdomen into the right lower quadrant. Findings are suggestive for bladder outlet obstruction or urinary retention. Prostate enlargement. 1.1 cm low-density structure in the spleen is likely an incidental finding. Electronically Signed   By: Markus Daft M.D.   On: 03/08/2018 19:14        Scheduled Meds: . aspirin  81 mg Oral Daily  . atorvastatin  80 mg Oral QHS  . finasteride  5 mg Oral Daily  . heparin  5,000 Units Subcutaneous Q8H  . hydrochlorothiazide  25 mg Oral Daily  . insulin aspart  0-9 Units Subcutaneous TID WC  . insulin glargine  3 Units Subcutaneous Daily  . phenazopyridine  100 mg Oral TID WC  . tamsulosin  0.4 mg Oral Daily   Continuous Infusions: . cefTRIAXone (ROCEPHIN)  IV 1 g (03/09/18 2207)     LOS: 0 days    Time spent: 31min    Domenic Polite, MD [facility] Page via [email or web address], password [email or web address] After 7PM  please contact night-coverage  03/10/2018, 12:32 PM

## 2018-03-11 DIAGNOSIS — N12 Tubulo-interstitial nephritis, not specified as acute or chronic: Secondary | ICD-10-CM

## 2018-03-11 LAB — CBC
HEMATOCRIT: 33.7 % — AB (ref 39.0–52.0)
HEMOGLOBIN: 10.8 g/dL — AB (ref 13.0–17.0)
MCH: 29.1 pg (ref 26.0–34.0)
MCHC: 32 g/dL (ref 30.0–36.0)
MCV: 90.8 fL (ref 78.0–100.0)
Platelets: 241 10*3/uL (ref 150–400)
RBC: 3.71 MIL/uL — ABNORMAL LOW (ref 4.22–5.81)
RDW: 14.5 % (ref 11.5–15.5)
WBC: 6.8 10*3/uL (ref 4.0–10.5)

## 2018-03-11 LAB — BASIC METABOLIC PANEL
Anion gap: 11 (ref 5–15)
BUN: 12 mg/dL (ref 8–23)
CALCIUM: 8.3 mg/dL — AB (ref 8.9–10.3)
CHLORIDE: 101 mmol/L (ref 98–111)
CO2: 27 mmol/L (ref 22–32)
CREATININE: 1.43 mg/dL — AB (ref 0.61–1.24)
GFR calc Af Amer: 59 mL/min — ABNORMAL LOW (ref >60)
GFR calc non Af Amer: 51 mL/min — ABNORMAL LOW (ref >60)
GLUCOSE: 205 mg/dL — AB (ref 70–99)
Potassium: 3.5 mmol/L (ref 3.5–5.1)
Sodium: 139 mmol/L (ref 135–145)

## 2018-03-11 LAB — GLUCOSE, CAPILLARY
Glucose-Capillary: 232 mg/dL — ABNORMAL HIGH (ref 70–99)
Glucose-Capillary: 224 mg/dL — ABNORMAL HIGH (ref 70–99)

## 2018-03-11 MED ORDER — AMLODIPINE BESYLATE 10 MG PO TABS
10.0000 mg | ORAL_TABLET | Freq: Every day | ORAL | Status: DC
  Administered 2018-03-11: 10 mg via ORAL
  Filled 2018-03-11: qty 1

## 2018-03-11 MED ORDER — POLYETHYLENE GLYCOL 3350 17 G PO PACK
17.0000 g | PACK | Freq: Every day | ORAL | Status: DC
  Administered 2018-03-11: 17 g via ORAL
  Filled 2018-03-11: qty 1

## 2018-03-11 MED ORDER — CIPROFLOXACIN HCL 500 MG PO TABS: 500.0000 mg | ORAL_TABLET | Freq: Two times a day (BID) | ORAL | 0 refills | Status: AC

## 2018-03-11 MED ORDER — TAMSULOSIN HCL 0.4 MG PO CAPS: 0.8000 mg | ORAL_CAPSULE | Freq: Every day | ORAL | 0 refills | Status: AC

## 2018-03-11 MED ORDER — TAMSULOSIN HCL 0.4 MG PO CAPS
0.8000 mg | ORAL_CAPSULE | Freq: Every day | ORAL | Status: DC
  Administered 2018-03-11: 0.8 mg via ORAL
  Filled 2018-03-11: qty 2

## 2018-03-11 MED ORDER — PHENAZOPYRIDINE HCL 100 MG PO TABS: 100.0000 mg | ORAL_TABLET | Freq: Three times a day (TID) | ORAL | 1 refills | Status: AC

## 2018-03-11 MED ORDER — AMLODIPINE BESYLATE 10 MG PO TABS: 10.0000 mg | ORAL_TABLET | Freq: Every day | ORAL | 0 refills | Status: AC

## 2018-03-11 MED ORDER — INSULIN GLARGINE 100 UNIT/ML ~~LOC~~ SOLN
10.0000 [IU] | Freq: Every day | SUBCUTANEOUS | Status: DC
  Administered 2018-03-11: 10 [IU] via SUBCUTANEOUS
  Filled 2018-03-11: qty 0.1

## 2018-03-11 MED ORDER — POLYETHYLENE GLYCOL 3350 17 G PO PACK: 17.0000 g | PACK | Freq: Every day | ORAL | 0 refills | Status: AC | PRN

## 2018-03-11 NOTE — Care Management Note (Signed)
Case Management Note  Patient Details  Name: Manuel [name] MRN: [id number] Date of Birth: 1956-07-13  Subjective/Objective:                    Action/Plan:  Discussed discharge planning with patient at bedside. He declined home health RN , states he has been taught catheter care and is comfortable with same. Provided and explained [facility] letter . Patient voiced understanding. Bedside nurse aware patient declined [facility] text paged MD. Expected Discharge Date:                  Expected Discharge Plan:  Home/Self Care  In-House Referral:  Financial Counselor  Discharge planning Services  CM Consult, Medication Assistance, [place] Program  Post Acute Care Choice:  NA Choice offered to:  Patient  DME Arranged:  N/A DME Agency:  NA  HH Arranged:  Patient Refused [facility] Agency:  NA  Status of Service:  Completed, signed off  If discussed at [place] of Stay Meetings, dates discussed:    Additional Comments:  Marilu Favre, RN 03/11/2018, 11:38 AM

## 2018-03-11 NOTE — Progress Notes (Signed)
Education rendered to patient on how to use.connect and straps to his leg the ambulatory foley bag.How to empty and and pee bottle container was given to him for draining convenience of his pee from the foley bag.Questions answered to patient's satisfaction.

## 2018-03-11 NOTE — Progress Notes (Signed)
Patient ambulated with steady gait on the hallway.No complaint.

## 2018-03-11 NOTE — Discharge Summary (Signed)
Physician Discharge Summary  Manuel [name] TMA:[id number] DOB: 1955/12/12 DOA: 03/08/2018  PCP: Glendale Chard, MD  Admit date: 03/08/2018 Discharge date: 03/11/2018  Time spent: 35 minutes  Recommendations for Outpatient Follow-up:  1. PCP in one week 2. Urology Dr. Kathie Rhodes in one week for voiding trial/DC foley   Discharge Diagnoses:    Sepsis   Pyelonephritis   Acute urinary retention   Severe BPH   Type II diabetes mellitus with renal manifestations ([place])   Essential hypertension   Hyperlipidemia   BPH (benign prostatic hyperplasia)   Acute renal failure superimposed on stage 3 chronic kidney disease ([place])   Sepsis ([place])   Hydronephrosis, bilateral   Lesion of spleen   Discharge Condition: stable  Diet recommendation: low sodium diabetic  Filed Weights   03/08/18 1630 03/08/18 2320  Weight: 86.2 kg 85.2 kg    History of present illness:  Manuel [name] a 62 y.o.malewith medical history significant ofhypertension, hyperlipidemia, diabetes mellitus, BPH, CKD 3, who presents withdysuria, burning on urination, increased urinary frequency, abdominal pain, nausea, vomiting. -found to have acute urinary retention with bilateral hydronephrosis and AKI, complicated by pyelonephritis and severe hyperglycemia  Hospital Course:   Sepsis due to pyelonephritis -was febrile on admission with leukocytosis of 20 K and abnormal urinalysis -Clinically improving, sepsis physiology has resolved -completed 3 days of IV ceftriaxone, urine culture has returned negative -changed to ciprofloxacin at discharge for one more week since he will be going home with a Foley catheter per urology recommendations  Acute urinary retention with bilateral hydronephrosis and acute kidney injury -long history of BPH, intermittent poor compliance with tamsulosin -hydrated with IV fluids, Foley catheter placed in the ED 8/6, continue this for 7-10 days -creatinine improving and down to baseline,  Continue Flomax, dose increased to 0.8 -Started on Phenazopyridine/pyridiumdue to bladder discomfort from Foley catheter -seen by urology Dr.Ottelin this admit, recommended increased dose of Flomax, continue Foley catheter discharge and follow-up in one week at urology office for a voiding trial  Type II diabetes mellitus with renal manifestations ([facility]):Last A1c12.2 on 03/23/16, poorlycontroled. Patient is takingmetforminat home.Blood sugar 535, anion gap 17, butbicarbonate 24and no ketones in UA on admission -given low-dose Lantus this admission -Repeat hemoglobin A1c is 9, resume metformin at DC since AKI better, also resumed Glipizide -may need additional oral hypoglycemics, advised patient to keep a close eye on his blood sugars and follow-up with his PCP in one week  Essential hypertension: -resumed HCTZ -ARB stopped, started amlodipine  Hyperlipidemia: -lipitor  BPH (benign prostatic hyperplasia): -as above  Lesion of spleen:CT scan incidentally showed 1.1 cm low-density structure along the superior aspect of the spleen which is less conspicuous on the delayed images. This probably represents a benign etiology such as hemangiomaperradiologist -f/u with PCP -may need to give referral tooncologist for further evaluation treatment    Discharge Exam: Vitals:   03/11/18 0517 03/11/18 0818  BP: (!) 155/75 (!) 179/84  Pulse: (!) 56 (!) 53  Resp: 16 20  Temp: 98.8 F (37.1 C) 98.4 F (36.9 C)  SpO2: 95% 99%    General: AAOx3 Cardiovascular: S1S2/RRR Respiratory: CTAB  Discharge Instructions   Discharge Instructions    Diet - low sodium heart healthy   Complete by:  As directed    Diet Carb Modified   Complete by:  As directed    Increase activity slowly   Complete by:  As directed      Allergies as of 03/11/2018   No Known  Allergies     Medication List    STOP taking these medications   olmesartan 40 MG tablet Commonly known as:  BENICAR      TAKE these medications   amLODipine 10 MG tablet Commonly known as:  NORVASC Take 1 tablet (10 mg total) by mouth daily. Start taking on:  03/12/2018   aspirin 81 MG chewable tablet Chew 1 tablet (81 mg total) by mouth daily.   atorvastatin 80 MG tablet Commonly known as:  LIPITOR Take 1 tablet (80 mg total) by mouth at bedtime.   ciprofloxacin 500 MG tablet Commonly known as:  CIPRO Take 1 tablet (500 mg total) by mouth 2 (two) times daily for 7 days.   glimepiride 4 MG tablet Commonly known as:  AMARYL Take 1 tablet (4 mg total) by mouth daily with breakfast.   hydrochlorothiazide 25 MG tablet Commonly known as:  HYDRODIURIL Take 25 mg by mouth daily.   metFORMIN 1000 MG tablet Commonly known as:  GLUCOPHAGE Take 1 tablet (1,000 mg total) by mouth 2 (two) times daily with a meal. What changed:  when to take this   phenazopyridine 100 MG tablet Commonly known as:  PYRIDIUM Take 1 tablet (100 mg total) by mouth 3 (three) times daily with meals for 5 days.   polyethylene glycol packet Commonly known as:  MIRALAX / GLYCOLAX Take 17 g by mouth daily as needed.   tamsulosin 0.4 MG Caps capsule Commonly known as:  FLOMAX Take 2 capsules (0.8 mg total) by mouth daily. What changed:  how much to take      No Known Allergies Follow-up Information    Call Kathie Rhodes, MD.   Specialty:  Urology Why:  For an appointment in ~1 week when you get home. Contact information: [place] [address] [phone]        Glendale Chard, MD. Schedule an appointment as soon as possible for a visit in 1 week(s).   Specialty:  Internal Medicine Contact information: [address] [place] [address] [phone]            The results of significant diagnostics from this hospitalization (including imaging, microbiology, ancillary and laboratory) are listed below for reference.    Significant Diagnostic Studies: Ct Abdomen Pelvis W  Contrast  Result Date: 03/08/2018 CLINICAL DATA:  62 year old with abdominal pain. Diverticulitis suspected. EXAM: CT ABDOMEN AND PELVIS WITH CONTRAST TECHNIQUE: Multidetector CT imaging of the abdomen and pelvis was performed using the standard protocol following bolus administration of intravenous contrast. CONTRAST:  121mL OMNIPAQUE IOHEXOL 300 MG/ML  SOLN COMPARISON:  None. FINDINGS: Lower chest: Prominent lung markings at both lung bases and findings may represent postinflammatory changes. No pleural effusions. Hepatobiliary: Normal appearance of the liver, gallbladder and portal venous system. Pancreas: Unremarkable. No pancreatic ductal dilatation or surrounding inflammatory changes. Spleen: 1.1 cm low-density structure along the superior aspect of the spleen which is less conspicuous on the delayed images. This probably represents a benign etiology such as hemangioma. Adrenals/Urinary Tract: Normal appearance of the adrenal glands. Urinary bladder is distended and extends into the lower abdomen. There is moderate bilateral hydronephrosis and extensive perinephric edema and perinephric fluid bilaterally. There is fluid tracking into the right lower quadrant and anterior aspect of the periaortic region. Stomach/Bowel: Large amount of stool in the cecum which is located in the right upper abdomen. No evidence for bowel obstruction. Vascular/Lymphatic: Atherosclerotic calcifications in the abdominal aorta without aneurysm. There is no significant lymph node  enlargement in the abdomen or pelvis. IVC and renal veins are patent. Reproductive: Prostate is prominent for size and extends into the base of the bladder. Prostate roughly measures 5.8 cm in the craniocaudal dimension. Other: Fluid tracking in the right lower quadrant which appears to be associated with the retroperitoneal and bilateral perinephric edema. Negative for free intraperitoneal air. Musculoskeletal: No acute bone abnormality. IMPRESSION: Bladder  distension with bilateral hydronephrosis with extensive perinephric edema and fluid. There is fluid tracking down the abdomen into the right lower quadrant. Findings are suggestive for bladder outlet obstruction or urinary retention. Prostate enlargement. 1.1 cm low-density structure in the spleen is likely an incidental finding. Electronically Signed   By: Markus Daft M.D.   On: 03/08/2018 19:14    Microbiology: Recent Results (from the past 240 hour(s))  Urine Culture     Status: None   Collection Time: 03/08/18 10:46 PM  Result Value Ref Range Status   Specimen Description URINE, RANDOM  Final   Special Requests NONE  Final   Culture   Final    NO GROWTH Performed at [facility], [address], [place], [place] [address]    Report Status 03/10/2018 FINAL  Final  Culture, blood (Routine X 2) w Reflex to ID Panel     Status: None (Preliminary result)   Collection Time: 03/08/18 11:36 PM  Result Value Ref Range Status   Specimen Description BLOOD LEFT ARM  Final   Special Requests   Final    BOTTLES DRAWN AEROBIC AND ANAEROBIC Blood Culture adequate volume   Culture   Final    NO GROWTH 2 DAYS Performed at [facility], [place] [address], [place], [place] [address]    Report Status PENDING  Incomplete  Culture, blood (Routine X 2) w Reflex to ID Panel     Status: None (Preliminary result)   Collection Time: 03/08/18 11:36 PM  Result Value Ref Range Status   Specimen Description BLOOD LEFT ARM  Final   Special Requests   Final    BOTTLES DRAWN AEROBIC AND ANAEROBIC Blood Culture adequate volume   Culture   Final    NO GROWTH 2 DAYS Performed at [facility], [place] [address] [place], [place] [address]    Report Status PENDING  Incomplete     Labs: Basic Metabolic Panel: Recent Labs  Lab 03/08/18 1637 03/09/18 0420 03/10/18 0604 03/11/18 0403  NA 134* 139 140 139  K 3.9 3.6 3.7 3.5  CL 93* 100 105 101  CO2 24 29 26 27   GLUCOSE 535* 320* 218* 205*  BUN  16 14 11 12   CREATININE 2.06* 1.79* 1.48* 1.43*  CALCIUM 8.7* 7.6* 7.8* 8.3*   Liver Function Tests: Recent Labs  Lab 03/08/18 1637  AST 31  ALT 29  ALKPHOS 104  BILITOT 0.8  PROT 7.1  ALBUMIN 3.7   Recent Labs  Lab 03/08/18 1637  LIPASE 37   No results for input(s): AMMONIA in the last 168 hours. CBC: Recent Labs  Lab 03/08/18 1637 03/09/18 0420 03/10/18 0604 03/11/18 0403  WBC 20.6* 17.5* 10.7* 6.8  HGB 12.4* 10.8* 10.7* 10.8*  HCT 37.7* 32.8* 32.5* 33.7*  MCV 89.8 89.9 90.3 90.8  PLT 225 221 209 241   Cardiac Enzymes: No results for input(s): CKTOTAL, CKMB, CKMBINDEX, TROPONINI in the last 168 hours. BNP: BNP (last 3 results) No results for input(s): BNP in the last 8760 hours.  ProBNP (last 3 results) No results  for input(s): PROBNP in the last 8760 hours.  CBG: Recent Labs  Lab 03/10/18 1146 03/10/18 1650 03/10/18 2133 03/11/18 0732 03/11/18 1154  GLUCAP 235* 246* 215* 232* 224*       Signed:  Domenic Polite MD.  [facility] 03/11/2018, 3:33 PM

## 2018-03-11 NOTE — Progress Notes (Signed)
DIscharge instructions and prescriptions given. Pt verballized understanding.  Pt awaiting transportation home.

## 2018-03-11 NOTE — Progress Notes (Signed)
Inpatient Diabetes Program Recommendations  AACE/ADA: New Consensus Statement on Inpatient Glycemic Control (2015)  Target Ranges:  Prepandial:   less than 140 mg/dL      Peak postprandial:   less than 180 mg/dL (1-2 hours)      Critically ill patients:  140 - 180 mg/dL   Results for Manuel, [name] (MRN [id number]) as of 03/11/2018 08:40  Ref. Range 03/10/2018 07:31 03/10/2018 11:46 03/10/2018 16:50 03/10/2018 21:33 03/11/2018 07:32  Glucose-Capillary Latest Ref Range: 70 - 99 mg/dL 214 (H) 235 (H) 246 (H) 215 (H) 232 (H)   Review of Glycemic Control  Diabetes history: DM2 Outpatient Diabetes medications: Amaryl 4 mg + Metformin 1 gm qd Current orders for Inpatient glycemic control: Lantus 5 units + Novolog 0-9 units tid  Inpatient Diabetes Program Recommendations:   -Consider increasing Lantus to 10 units daily   Thanks,  Tama Headings RN, MSN, BC-ADM Inpatient Diabetes Coordinator Team Pager [phone] (8a-5p)

## 2018-03-11 NOTE — Progress Notes (Signed)
Instructed patient and wife on how to change from foley to leg bag and back.  Pt go home with foley catheter for urology follow-up.  Patient and wife verbalized understanding.

## 2018-03-14 LAB — CULTURE, BLOOD (ROUTINE X 2)
CULTURE: NO GROWTH
Culture: NO GROWTH
SPECIAL REQUESTS: ADEQUATE
Special Requests: ADEQUATE

## 2018-03-17 ENCOUNTER — Encounter (HOSPITAL_COMMUNITY): Payer: Self-pay

## 2018-03-17 ENCOUNTER — Emergency Department (HOSPITAL_COMMUNITY)
Admission: EM | Admit: 2018-03-17 | Discharge: 2018-03-18 | Disposition: A | Discharge status: Home / Self Care | Payer: BLUE CROSS/BLUE SHIELD | Attending: Emergency Medicine | Admitting: Emergency Medicine

## 2018-03-17 DIAGNOSIS — R339 Retention of urine, unspecified: Secondary | ICD-10-CM | POA: Diagnosis not present

## 2018-03-17 DIAGNOSIS — I129 Hypertensive chronic kidney disease with stage 1 through stage 4 chronic kidney disease, or unspecified chronic kidney disease: Secondary | ICD-10-CM | POA: Insufficient documentation

## 2018-03-17 DIAGNOSIS — Z7982 Long term (current) use of aspirin: Secondary | ICD-10-CM | POA: Diagnosis not present

## 2018-03-17 DIAGNOSIS — Z7984 Long term (current) use of oral hypoglycemic drugs: Secondary | ICD-10-CM | POA: Insufficient documentation

## 2018-03-17 DIAGNOSIS — E1122 Type 2 diabetes mellitus with diabetic chronic kidney disease: Secondary | ICD-10-CM | POA: Diagnosis not present

## 2018-03-17 DIAGNOSIS — R338 Other retention of urine: Secondary | ICD-10-CM | POA: Diagnosis not present

## 2018-03-17 DIAGNOSIS — Z436 Encounter for attention to other artificial openings of urinary tract: Secondary | ICD-10-CM | POA: Insufficient documentation

## 2018-03-17 DIAGNOSIS — Z79899 Other long term (current) drug therapy: Secondary | ICD-10-CM | POA: Diagnosis not present

## 2018-03-17 DIAGNOSIS — N401 Enlarged prostate with lower urinary tract symptoms: Secondary | ICD-10-CM | POA: Diagnosis not present

## 2018-03-17 DIAGNOSIS — N183 Chronic kidney disease, stage 3 (moderate): Secondary | ICD-10-CM | POA: Insufficient documentation

## 2018-03-17 DIAGNOSIS — T839XXA Unspecified complication of genitourinary prosthetic device, implant and graft, initial encounter: Secondary | ICD-10-CM

## 2018-03-17 LAB — URINALYSIS, ROUTINE W REFLEX MICROSCOPIC
Bilirubin Urine: NEGATIVE
Glucose, UA: NEGATIVE mg/dL
Ketones, ur: NEGATIVE mg/dL
NITRITE: NEGATIVE
PH: 6 (ref 5.0–8.0)
Protein, ur: 30 mg/dL — AB
RBC / HPF: >50 RBC/hpf — ABNORMAL HIGH (ref 0–5)
SPECIFIC GRAVITY, URINE: 1.005 (ref 1.005–1.030)

## 2018-03-17 MED ORDER — MORPHINE SULFATE (PF) 2 MG/ML IV SOLN
2.0000 mg | Freq: Once | INTRAVENOUS | Status: AC
  Administered 2018-03-18: 2 mg via INTRAVENOUS
  Filled 2018-03-17: qty 1

## 2018-03-17 NOTE — ED Triage Notes (Signed)
Pt complains of urinary retention and leakage around his catheter since he got the new one this afternoon Pt states he's starting to feel like his bladder is full

## 2018-03-17 NOTE — Discharge Instructions (Addendum)
1. Medications: usual home medications 2. Treatment: rest, drink plenty of fluids,  3. Follow Up: Please followup with your primary doctor in 2-3 days for discussion of your diagnoses and further evaluation after today's visit; if you do not have a primary care doctor use the resource guide provided to find one; Please return to the ER for new or worsening symptoms  

## 2018-03-17 NOTE — ED Provider Notes (Signed)
[facility] DEPT Provider Note   CSN: [id number] Arrival date & time: 03/17/18  2122     History   Chief Complaint Chief Complaint  Patient presents with  . Urinary Retention    HPI Manuel [name] is a 62 y.o. male with a past medical history of BPH, hypertension, hyperlipidemia, diabetes who presents to ED for evaluation of feelings of urinary retention and possibly leaking around his urinary catheter.  He was seen and evaluated in the ED last week from 8/6-8/9 and was admitted for pyelonephritis, sepsis and urinary retention secondary to BPH.  He followed up with his urologist earlier today.  He had a voiding trial done which was unsuccessful.  They had to reinsert the Foley catheter.  However, he states that ever since the new catheter was placed earlier today, he feels like his bladder has been full and there is some leakage around the catheter site causing his clothes to get wet.  He denies having this problem with the previous catheter.  He was prescribed finasteride, tamsulosin to help with BPH.  He denies any abdominal pain, fever, vomiting, bowel changes.  HPI  Past Medical History:  Diagnosis Date  . BPH (benign prostatic hyperplasia)   . Diabetes mellitus without complication ([place])   . Hyperlipidemia   . Hypertension     Patient Active Problem List   Diagnosis Date Noted  . Pyelonephritis 03/08/2018  . Sepsis ([place]) 03/08/2018  . Hydronephrosis, bilateral 03/08/2018  . Lesion of spleen 03/08/2018  . Abnormal stress test   . Chest pain 03/21/2016  . Type II diabetes mellitus with renal manifestations ([place]) 03/21/2016  . Essential hypertension 03/21/2016  . Hyperlipidemia 03/21/2016  . BPH (benign prostatic hyperplasia) 03/21/2016  . Stress at work 03/21/2016  . Acute renal failure superimposed on stage 3 chronic kidney disease ([place]) 03/21/2016    Past Surgical History:  Procedure Laterality Date  . CARDIAC CATHETERIZATION N/A  03/23/2016   Procedure: Left Heart Cath and Coronary Angiography;  Surgeon: Nelva Bush, MD;  Location: [facility];  Service: Cardiovascular;  Laterality: N/A;        Home Medications    Prior to Admission medications   Medication Sig Start Date End Date Taking? Authorizing Provider  amLODipine (NORVASC) 10 MG tablet Take 1 tablet (10 mg total) by mouth daily. 03/12/18   Domenic Polite, MD  aspirin 81 MG chewable tablet Chew 1 tablet (81 mg total) by mouth daily. 03/24/16   Rai, Vernelle Emerald, MD  atorvastatin (LIPITOR) 80 MG tablet Take 1 tablet (80 mg total) by mouth at bedtime. 03/24/16   Rai, Vernelle Emerald, MD  ciprofloxacin (CIPRO) 500 MG tablet Take 1 tablet (500 mg total) by mouth 2 (two) times daily for 7 days. 03/11/18 03/18/18  Domenic Polite, MD  glimepiride (AMARYL) 4 MG tablet Take 1 tablet (4 mg total) by mouth daily with breakfast. Patient not taking: Reported on 03/08/2018 03/24/16   Rai, Vernelle Emerald, MD  hydrochlorothiazide (HYDRODIURIL) 25 MG tablet Take 25 mg by mouth daily.    [provider]  metFORMIN (GLUCOPHAGE) 1000 MG tablet Take 1 tablet (1,000 mg total) by mouth 2 (two) times daily with a meal. Patient taking differently: Take 1,000 mg by mouth daily with breakfast.  03/25/16   Rai, Ripudeep K, MD  polyethylene glycol (MIRALAX / GLYCOLAX) packet Take 17 g by mouth daily as needed. 03/11/18   Domenic Polite, MD  tamsulosin (FLOMAX) 0.4 MG CAPS capsule Take 2 capsules (  0.8 mg total) by mouth daily. 03/11/18   Domenic Polite, MD    Family History Family History  Problem Relation Age of Onset  . Scleroderma Mother 15  . Congestive Heart Failure Father 24    Social History Social History   Tobacco Use  . Smoking status: Never Smoker  . Smokeless tobacco: Never Used  Substance Use Topics  . Alcohol use: No  . Drug use: No     Allergies   Patient has no known allergies.   Review of Systems Review of Systems  Constitutional: Negative for  appetite change, chills and fever.  HENT: Negative for ear pain, rhinorrhea, sneezing and sore throat.   Eyes: Negative for photophobia and visual disturbance.  Respiratory: Negative for cough, chest tightness, shortness of breath and wheezing.   Cardiovascular: Negative for chest pain and palpitations.  Gastrointestinal: Negative for abdominal pain, blood in stool, constipation, diarrhea, nausea and vomiting.  Genitourinary: Positive for difficulty urinating. Negative for dysuria, hematuria and urgency.  Musculoskeletal: Negative for myalgias.  Skin: Negative for rash.  Neurological: Negative for dizziness, weakness and light-headedness.     Physical Exam Updated Vital Signs BP (!) 151/79 (BP Location: Left Arm)   Pulse 77   Temp 98.6 F (37 C) (Oral)   Resp 18   SpO2 99%   Physical Exam  Constitutional: He appears well-developed and well-nourished. No distress.  HENT:  Head: Normocephalic and atraumatic.  Nose: Nose normal.  Eyes: Conjunctivae and EOM are normal. Right eye exhibits no discharge. Left eye exhibits no discharge. No scleral icterus.  Neck: Normal range of motion. Neck supple.  Cardiovascular: Normal rate, regular rhythm, normal heart sounds and intact distal pulses. Exam reveals no gallop and no friction rub.  No murmur heard. Pulmonary/Chest: Effort normal and breath sounds normal. No respiratory distress.  Abdominal: Soft. Bowel sounds are normal. He exhibits no distension. There is no tenderness. There is no guarding.  No abdominal tenderness to palpation. Foley catheter in place with dark appearing urine.  Musculoskeletal: Normal range of motion. He exhibits no edema.  Neurological: He is alert. He exhibits normal muscle tone. Coordination normal.  Skin: Skin is warm and dry. No rash noted.  Psychiatric: He has a normal mood and affect.  Nursing note and vitals reviewed.    ED Treatments / Results  Labs (all labs ordered are listed, but only abnormal  results are displayed) Labs Reviewed  URINALYSIS, ROUTINE W REFLEX MICROSCOPIC - Abnormal; Notable for the following components:      Result Value   Hgb urine dipstick LARGE (*)    Protein, ur 30 (*)    Leukocytes, UA SMALL (*)    RBC / HPF >50 (*)    Bacteria, UA FEW (*)    All other components within normal limits  URINE CULTURE    EKG None  Radiology No results found.  Procedures Procedures (including critical care time)  Medications Ordered in ED Medications  morphine 2 MG/ML injection 2 mg (has no administration in time range)     Initial Impression / Assessment and Plan / ED Course  I have reviewed the triage vital signs and the nursing notes.  Pertinent labs & imaging results that were available during my care of the patient were reviewed by me and considered in my medical decision making (see chart for details).     61 year old male presents to ED for evaluation of leaking around his Foley catheter, feelings of bladder distention that began earlier today.  Patient was seen by his urologist and voiding trial was done.  He was unsuccessful so Foley had to be reinserted.  He has a history of BPH and is currently on finasteride and tamsulosin.  Patient was admitted to the hospital last week from 8/6-8/9 for pyelonephritis and sepsis, urinary retention 2/2 severe BPH. He was discharged home with ciprofloxacin.  He has been taking his medication as prescribed.  He did not have these issues with the catheter earlier in the week until it was changed today.  He denies any abdominal pain, fever, changes to bowel movements or vomiting.  Patient is afebrile here.  Foley catheter in place with dark urine noted.  Urinalysis shows leukocytes, some bacteria.  He is currently on ciprofloxacin for infection from last week.  Will need to evaluate Foley placement and may need to replace if necessary. Will provide pain medication for discomfort. Care handed off to oncoming provider, H.  Muthersbaugh, PA-C pending reassessment. Patient will need Foley per urology recommendations until further notice.  Portions of this note were generated with [employer]. Dictation errors may occur despite best attempts at proofreading.   Final Clinical Impressions(s) / ED Diagnoses   Final diagnoses:  Problem with Foley catheter, initial encounter [facility])    ED Discharge Orders    None       Delia Heady, PA-C 03/18/18 0003    Jola Schmidt, MD 03/23/18 [phone]

## 2018-03-18 ENCOUNTER — Other Ambulatory Visit: Payer: Self-pay

## 2018-03-18 LAB — CBG MONITORING, ED: GLUCOSE-CAPILLARY: 229 mg/dL — AB (ref 70–99)

## 2018-03-18 MED ORDER — LIDOCAINE HCL URETHRAL/MUCOSAL 2 % EX GEL
1.0000 "application " | Freq: Once | CUTANEOUS | Status: DC
  Filled 2018-03-18: qty 5

## 2018-03-18 NOTE — ED Notes (Signed)
Patient requested to have blood sugar checked. Notified RN

## 2018-03-18 NOTE — ED Notes (Addendum)
Bladder scan volume: 0 mL. 

## 2018-03-18 NOTE — ED Provider Notes (Signed)
Care assumed from [facility] [place].  Please see her full H&P.  In short,  Manuel [name] is a 62 y.o. male presents for [facility] discomfort, urinary retention and possible leaking around his urinary catheter.  Patient had a urinary catheter changed at his urologist today when the symptoms began.  UA was without evidence of urinary tract infection.  Patient will need urinary catheter readjusted.  Physical Exam  BP (!) 141/66 (BP Location: Left Arm)   Pulse 91   Temp 98.6 F (37 C) (Oral)   Resp 16   SpO2 98%   Physical Exam  Constitutional: He appears well-developed.  HENT:  Head: Normocephalic.  Eyes: Conjunctivae are normal. No scleral icterus.  Pulmonary/Chest: Effort normal.  Abdominal: Soft. There is no tenderness.  Neurological: He is alert.  Skin: Skin is warm and dry.  Nursing note and vitals reviewed.   ED Course/Procedures   Clinical Course as of Mar 18 325  Fri Mar 18, 2018  0010 Plan: Patient will need his urinary catheter readjusted.  If this does not solve the problem he will need it changed.   [HM]    Clinical Course User Index [HM] Ravindra Baranek, Jarrett Soho, PA-C    Procedures  MDM     Bulb was deflated and reinflated.  At that time patient drained almost 400 mL's of fluid and discomfort resolved.  He is no longer leaking around the catheter.  On repeat exam, his abdomen is soft and nontender.  He reports he feels much better and is ready for discharge home.  Discussed close follow-up with primary care and/or urology as needed.  Patient may return to the emergency department for new or worsening symptoms.  He states understanding and is in agreement with this plan.  Problem with Foley catheter, initial encounter [facility])       Vernelle Wisner, Gwenlyn Perking 03/18/18 0326    Jola Schmidt, MD 03/18/18 [phone]

## 2018-03-18 NOTE — ED Notes (Signed)
Notified RN of glucose of 229

## 2018-03-19 LAB — URINE CULTURE: Culture: NO GROWTH

## 2018-03-30 DIAGNOSIS — R338 Other retention of urine: Secondary | ICD-10-CM | POA: Diagnosis not present

## 2018-04-10 ENCOUNTER — Encounter (HOSPITAL_COMMUNITY): Payer: Self-pay | Admitting: Emergency Medicine

## 2018-04-10 ENCOUNTER — Other Ambulatory Visit: Payer: Self-pay

## 2018-04-10 ENCOUNTER — Emergency Department (HOSPITAL_COMMUNITY)
Admission: EM | Admit: 2018-04-10 | Discharge: 2018-04-10 | Disposition: A | Discharge status: Home / Self Care | Payer: BLUE CROSS/BLUE SHIELD | Attending: Emergency Medicine | Admitting: Emergency Medicine

## 2018-04-10 DIAGNOSIS — E1122 Type 2 diabetes mellitus with diabetic chronic kidney disease: Secondary | ICD-10-CM | POA: Insufficient documentation

## 2018-04-10 DIAGNOSIS — N183 Chronic kidney disease, stage 3 (moderate): Secondary | ICD-10-CM | POA: Insufficient documentation

## 2018-04-10 DIAGNOSIS — Z79899 Other long term (current) drug therapy: Secondary | ICD-10-CM | POA: Insufficient documentation

## 2018-04-10 DIAGNOSIS — Z7982 Long term (current) use of aspirin: Secondary | ICD-10-CM | POA: Insufficient documentation

## 2018-04-10 DIAGNOSIS — R339 Retention of urine, unspecified: Secondary | ICD-10-CM | POA: Diagnosis not present

## 2018-04-10 DIAGNOSIS — Z7984 Long term (current) use of oral hypoglycemic drugs: Secondary | ICD-10-CM | POA: Insufficient documentation

## 2018-04-10 NOTE — Discharge Instructions (Addendum)
As discussed, your evaluation today has been largely reassuring.  But, it is important that you monitor your condition carefully, and do not hesitate to return to the ED if you develop new, or concerning changes in your condition.  Although you are scheduled to follow-up with your urologist later this week, please call tomorrow to let them know that you are here due to urinary retention, Foley catheter malfunction.

## 2018-04-10 NOTE — ED Provider Notes (Signed)
[facility] EMERGENCY DEPARTMENT Provider Note   CSN: [id number] Arrival date & time: 04/10/18  0709     History   Chief Complaint Chief Complaint  Patient presents with  . Urinary Retention    catheter blocked    HPI Manuel [name] is a 62 y.o. male.  HPI Patient presents with concern of malfunctioning Foley catheter. He has a history of BPH, has had a catheter for several months, most recently evaluated by urology 2 weeks ago. Over the past 36 hours the patient has had minimal production of urine from the catheter itself, has had increasing leaking around the edges, as well as increasing suprapubic pain. No other new complaints including fever, vomiting, other pain. He states that he felt that it might get better on its own, thus the delay in seeking care.  Past Medical History:  Diagnosis Date  . BPH (benign prostatic hyperplasia)   . Diabetes mellitus without complication ([place])   . Hyperlipidemia   . Hypertension     Patient Active Problem List   Diagnosis Date Noted  . Pyelonephritis 03/08/2018  . Sepsis ([place]) 03/08/2018  . Hydronephrosis, bilateral 03/08/2018  . Lesion of spleen 03/08/2018  . Abnormal stress test   . Chest pain 03/21/2016  . Type II diabetes mellitus with renal manifestations ([place]) 03/21/2016  . Essential hypertension 03/21/2016  . Hyperlipidemia 03/21/2016  . BPH (benign prostatic hyperplasia) 03/21/2016  . Stress at work 03/21/2016  . Acute renal failure superimposed on stage 3 chronic kidney disease ([place]) 03/21/2016    Past Surgical History:  Procedure Laterality Date  . CARDIAC CATHETERIZATION N/A 03/23/2016   Procedure: Left [name] Cath and Coronary Angiography;  Surgeon: Nelva Bush, MD;  Location: [facility];  Service: Cardiovascular;  Laterality: N/A;        Home Medications    Prior to Admission medications   Medication Sig Start Date End Date Taking? Authorizing Provider  amLODipine (NORVASC) 10  MG tablet Take 1 tablet (10 mg total) by mouth daily. 03/12/18   Domenic Polite, MD  aspirin 81 MG chewable tablet Chew 1 tablet (81 mg total) by mouth daily. 03/24/16   Rai, Vernelle Emerald, MD  atorvastatin (LIPITOR) 80 MG tablet Take 1 tablet (80 mg total) by mouth at bedtime. 03/24/16   Rai, Vernelle Emerald, MD  glimepiride (AMARYL) 4 MG tablet Take 1 tablet (4 mg total) by mouth daily with breakfast. Patient not taking: Reported on 03/08/2018 03/24/16   Rai, Vernelle Emerald, MD  hydrochlorothiazide (HYDRODIURIL) 25 MG tablet Take 25 mg by mouth daily.    [provider]  metFORMIN (GLUCOPHAGE) 1000 MG tablet Take 1 tablet (1,000 mg total) by mouth 2 (two) times daily with a meal. Patient taking differently: Take 1,000 mg by mouth daily with breakfast.  03/25/16   Rai, Ripudeep K, MD  polyethylene glycol (MIRALAX / GLYCOLAX) packet Take 17 g by mouth daily as needed. 03/11/18   Domenic Polite, MD  tamsulosin (FLOMAX) 0.4 MG CAPS capsule Take 2 capsules (0.8 mg total) by mouth daily. 03/11/18   Domenic Polite, MD    Family History Family History  Problem Relation Age of Onset  . Scleroderma Mother 81  . Congestive [name] Failure Father 24    Social History Social History   Tobacco Use  . Smoking status: Never Smoker  . Smokeless tobacco: Never Used  Substance Use Topics  . Alcohol use: No  . Drug use: No     Allergies  Patient has no known allergies.   Review of Systems Review of Systems  Constitutional:       Per HPI, otherwise negative  HENT:       Per HPI, otherwise negative  Respiratory:       Per HPI, otherwise negative  Cardiovascular:       Per HPI, otherwise negative  Gastrointestinal: Negative for vomiting.  Endocrine:       Negative aside from HPI  Genitourinary:       Neg aside from HPI   Musculoskeletal:       Per HPI, otherwise negative  Skin: Negative.   Neurological: Negative for syncope.     Physical Exam Updated Vital Signs BP (!) 147/81 (BP Location:  Right Arm)   Pulse (!) 57   Temp 97.6 F (36.4 C) (Oral)   Ht 5\' 7"  (1.702 m)   Wt 85.7 kg   SpO2 99%   BMI 29.60 kg/m   Physical Exam  Constitutional: He is oriented to person, place, and time. He appears well-developed. No distress.  HENT:  Head: Normocephalic and atraumatic.  Eyes: Conjunctivae and EOM are normal.  Cardiovascular: Normal rate and regular rhythm.  Pulmonary/Chest: Effort normal. No stridor. No respiratory distress.  Abdominal: He exhibits no distension. There is tenderness. There is guarding.  Musculoskeletal: He exhibits no edema.  Neurological: He is alert and oriented to person, place, and time.  Skin: Skin is warm and dry.  Psychiatric: He has a normal mood and affect.  Nursing note and vitals reviewed.    ED Treatments / Results   Procedures Procedures (including critical care time)    Initial Impression / Assessment and Plan / ED Course  I have reviewed the triage vital signs and the nursing notes.  Pertinent labs & imaging results that were available during my care of the patient were reviewed by me and considered in my medical decision making (see chart for details).     9:53 AM Patient has had irrigation of his Foley catheter with production of substantial amounts of urine. Initial bladder scan had shown approximately 2 5 0 mL of urine, there is some suspicion for retention but given this resolution of symptoms following irrigation of his catheter, absence of continued leakage around the catheter itself, patient is appropriate for follow-up with his urologist.   Final Clinical Impressions(s) / ED Diagnoses   Final diagnoses:  Urinary retention     Carmin Muskrat, MD 04/10/18 [phone]

## 2018-04-10 NOTE — ED Triage Notes (Signed)
Pt. Stated, my catheter is block or clogged . I have an enlarged prostate

## 2018-04-11 DIAGNOSIS — R338 Other retention of urine: Secondary | ICD-10-CM | POA: Diagnosis not present

## 2018-04-13 DIAGNOSIS — R972 Elevated prostate specific antigen [PSA]: Secondary | ICD-10-CM | POA: Diagnosis not present

## 2018-04-13 DIAGNOSIS — R338 Other retention of urine: Secondary | ICD-10-CM | POA: Diagnosis not present

## 2018-05-10 DIAGNOSIS — N401 Enlarged prostate with lower urinary tract symptoms: Secondary | ICD-10-CM | POA: Diagnosis not present

## 2018-05-10 DIAGNOSIS — N138 Other obstructive and reflux uropathy: Secondary | ICD-10-CM | POA: Diagnosis not present

## 2018-05-10 DIAGNOSIS — I10 Essential (primary) hypertension: Secondary | ICD-10-CM | POA: Diagnosis not present

## 2018-05-10 DIAGNOSIS — E1165 Type 2 diabetes mellitus with hyperglycemia: Secondary | ICD-10-CM | POA: Diagnosis not present

## 2018-05-20 ENCOUNTER — Other Ambulatory Visit: Payer: Self-pay

## 2018-05-20 ENCOUNTER — Encounter (HOSPITAL_COMMUNITY): Payer: Self-pay | Admitting: *Deleted

## 2018-05-20 ENCOUNTER — Emergency Department (HOSPITAL_COMMUNITY)
Admission: EM | Admit: 2018-05-20 | Discharge: 2018-05-20 | Disposition: A | Discharge status: Home / Self Care | Payer: PRIVATE HEALTH INSURANCE | Attending: Emergency Medicine | Admitting: Emergency Medicine

## 2018-05-20 DIAGNOSIS — Z960 Presence of urogenital implants: Secondary | ICD-10-CM | POA: Diagnosis not present

## 2018-05-20 DIAGNOSIS — R339 Retention of urine, unspecified: Secondary | ICD-10-CM | POA: Insufficient documentation

## 2018-05-20 DIAGNOSIS — Z79899 Other long term (current) drug therapy: Secondary | ICD-10-CM | POA: Insufficient documentation

## 2018-05-20 DIAGNOSIS — I10 Essential (primary) hypertension: Secondary | ICD-10-CM | POA: Diagnosis not present

## 2018-05-20 DIAGNOSIS — Z7982 Long term (current) use of aspirin: Secondary | ICD-10-CM | POA: Diagnosis not present

## 2018-05-20 DIAGNOSIS — E1129 Type 2 diabetes mellitus with other diabetic kidney complication: Secondary | ICD-10-CM | POA: Insufficient documentation

## 2018-05-20 DIAGNOSIS — Z7984 Long term (current) use of oral hypoglycemic drugs: Secondary | ICD-10-CM | POA: Insufficient documentation

## 2018-05-20 LAB — URINALYSIS, ROUTINE W REFLEX MICROSCOPIC
Bilirubin Urine: NEGATIVE
Glucose, UA: NEGATIVE mg/dL
Ketones, ur: NEGATIVE mg/dL
NITRITE: NEGATIVE
Protein, ur: NEGATIVE mg/dL
SPECIFIC GRAVITY, URINE: 1.015 (ref 1.005–1.030)
pH: 5 (ref 5.0–8.0)

## 2018-05-20 LAB — URINALYSIS, MICROSCOPIC (REFLEX): SQUAMOUS EPITHELIAL / LPF: NONE SEEN (ref 0–5)

## 2018-05-20 MED ORDER — LIDOCAINE HCL URETHRAL/MUCOSAL 2 % EX GEL
1.0000 "application " | Freq: Once | CUTANEOUS | Status: AC
  Administered 2018-05-20: 1 via TOPICAL

## 2018-05-20 NOTE — ED Triage Notes (Signed)
The pt is c/o an inability to urinate since 2300  He had a procedure at [facility] today  That a camera was placed  Foley placed then removed   He was voiding  Then stopped voiding at 2300  Bladder scanned  He had 750 ml of urine in his bladder

## 2018-05-20 NOTE — ED Notes (Signed)
Leg drainage bag attached to foley cath.

## 2018-05-20 NOTE — Discharge Instructions (Addendum)
Follow up with your urologist by calling later today to let them know about your recurrent urinary retention. Follow up per their instruction. Return here as needed.

## 2018-05-20 NOTE — ED Notes (Signed)
Patient verbalizes understanding of discharge instructions. Opportunity for questioning and answers were provided. Armband removed by staff, pt discharged from ED ambulatory.   

## 2018-05-20 NOTE — ED Provider Notes (Signed)
[facility] EMERGENCY DEPARTMENT Provider Note   CSN: [id number] Arrival date & time: 05/20/18  0018     History   Chief Complaint Chief Complaint  Patient presents with  . Urinary Retention    HPI Manuel [name] is a 62 y.o. male.  Patient presents with urinary retention since around 11:30 last night. He has had an indwelling foley catheter for the past 2 months secondary to bladder dysfunction and had a cystoscopy today with his urologist at [facility] for further evaluation. The foley was removed during that visit and he reports he voided before he left the office but was unable to when he returned home. No fever, nausea, vomiting, testicular pain or scrotal swelling.  The history is provided by the patient and the spouse. No language interpreter was used.    Past Medical History:  Diagnosis Date  . BPH (benign prostatic hyperplasia)   . Diabetes mellitus without complication ([place])   . Hyperlipidemia   . Hypertension     Patient Active Problem List   Diagnosis Date Noted  . Pyelonephritis 03/08/2018  . Sepsis ([place]) 03/08/2018  . Hydronephrosis, bilateral 03/08/2018  . Lesion of spleen 03/08/2018  . Abnormal stress test   . Chest pain 03/21/2016  . Type II diabetes mellitus with renal manifestations ([place]) 03/21/2016  . Essential hypertension 03/21/2016  . Hyperlipidemia 03/21/2016  . BPH (benign prostatic hyperplasia) 03/21/2016  . Stress at work 03/21/2016  . Acute renal failure superimposed on stage 3 chronic kidney disease ([place]) 03/21/2016    Past Surgical History:  Procedure Laterality Date  . CARDIAC CATHETERIZATION N/A 03/23/2016   Procedure: Left Heart Cath and Coronary Angiography;  Surgeon: Nelva Bush, MD;  Location: [facility];  Service: Cardiovascular;  Laterality: N/A;        Home Medications    Prior to Admission medications   Medication Sig Start Date End Date Taking? Authorizing Provider  amLODipine (NORVASC) 5  MG tablet Take 5 mg by mouth daily. 05/03/18  Yes [provider]  aspirin 81 MG chewable tablet Chew 1 tablet (81 mg total) by mouth daily. 03/24/16  Yes Rai, Ripudeep K, MD  atorvastatin (LIPITOR) 10 MG tablet Take 10 mg by mouth daily. 05/10/18  Yes [provider]  finasteride (PROSCAR) 5 MG tablet Take 5 mg by mouth daily. 05/03/18  Yes [provider]  glimepiride (AMARYL) 2 MG tablet Take 2 mg by mouth daily. 05/10/18  Yes [provider]  hydrochlorothiazide (HYDRODIURIL) 25 MG tablet Take 25 mg by mouth daily.   Yes [provider]  metFORMIN (GLUCOPHAGE-XR) 500 MG 24 hr tablet Take 1,000 mg by mouth at bedtime. 05/03/18  Yes [provider]  Meth-Hyo-M Bl-Na Phos-Ph Sal (URO-MP) 118 MG CAPS Take 1 capsule by mouth 4 (four) times daily. 05/11/18  Yes [provider]  tamsulosin (FLOMAX) 0.4 MG CAPS capsule Take 2 capsules (0.8 mg total) by mouth daily. 03/11/18  Yes Domenic Polite, MD  amLODipine (NORVASC) 10 MG tablet Take 1 tablet (10 mg total) by mouth daily. Patient not taking: Reported on 05/20/2018 03/12/18   Domenic Polite, MD  atorvastatin (LIPITOR) 80 MG tablet Take 1 tablet (80 mg total) by mouth at bedtime. Patient not taking: Reported on 05/20/2018 03/24/16   Rai, Vernelle Emerald, MD  glimepiride (AMARYL) 4 MG tablet Take 1 tablet (4 mg total) by mouth daily with breakfast. Patient not taking: Reported on 03/08/2018 03/24/16   Mendel Corning, MD  metFORMIN (GLUCOPHAGE)  1000 MG tablet Take 1 tablet (1,000 mg total) by mouth 2 (two) times daily with a meal. Patient not taking: Reported on 05/20/2018 03/25/16   Rai, Vernelle Emerald, MD  polyethylene glycol (MIRALAX / GLYCOLAX) packet Take 17 g by mouth daily as needed. Patient not taking: Reported on 05/20/2018 03/11/18   Domenic Polite, MD    Family History Family History  Problem Relation Age of Onset  . Scleroderma Mother 37  . Congestive Heart Failure Father 11    Social  History Social History   Tobacco Use  . Smoking status: Never Smoker  . Smokeless tobacco: Never Used  Substance Use Topics  . Alcohol use: No  . Drug use: No     Allergies   Patient has no known allergies.   Review of Systems Review of Systems  Constitutional: Negative for chills and fever.  Respiratory: Negative.   Cardiovascular: Negative.   Gastrointestinal: Positive for abdominal pain. Negative for nausea and vomiting.  Genitourinary: Positive for difficulty urinating. Negative for scrotal swelling and testicular pain.  Musculoskeletal: Negative.   Skin: Negative.   Neurological: Negative.      Physical Exam Updated Vital Signs BP 139/81   Pulse 84   Temp 97.9 F (36.6 C) (Oral)   Resp 16   Ht 5\' 7"  (1.702 m)   Wt 85.7 kg   SpO2 96%   BMI 29.60 kg/m   Physical Exam  Constitutional: He is oriented to person, place, and time. He appears well-developed and well-nourished.  Neck: Normal range of motion.  Pulmonary/Chest: Effort normal.  Abdominal:  Lower abdominal distention. Tender.   Genitourinary:  Genitourinary Comments: Circumcised penis. Small amount of blood at meatus. No testicular tenderness.   Musculoskeletal: Normal range of motion.  Neurological: He is alert and oriented to person, place, and time.  Skin: Skin is warm and dry.  Psychiatric: He has a normal mood and affect.     ED Treatments / Results  Labs (all labs ordered are listed, but only abnormal results are displayed) Labs Reviewed  URINALYSIS, ROUTINE W REFLEX MICROSCOPIC - Abnormal; Notable for the following components:      Result Value   Color, Urine BLUE (*)    Hgb urine dipstick LARGE (*)    Leukocytes, UA TRACE (*)    All other components within normal limits  URINALYSIS, MICROSCOPIC (REFLEX) - Abnormal; Notable for the following components:   Bacteria, UA RARE (*)    All other components within normal limits  URINE CULTURE    EKG None  Radiology No results  found.  Procedures Procedures (including critical care time)  Medications Ordered in ED Medications  lidocaine (XYLOCAINE) 2 % jelly 1 application (1 application Topical Given 05/20/18 0244)     Initial Impression / Assessment and Plan / ED Course  I have reviewed the triage vital signs and the nursing notes.  Pertinent labs & imaging results that were available during my care of the patient were reviewed by me and considered in my medical decision making (see chart for details).     Patient presents with recurrent urinary retention after indwelling foley removed today by urology after cystoscopy. He had a successful void prior to leaving the office but was unable to urinate after returning home. No fever.   Foley catheter placed here with resolution of abdominal pain. No apparent UTI. Will place leg bag and refer [facility] where he receives ongoing care for recheck and further management.   Final Clinical  Impressions(s) / ED Diagnoses   Final diagnoses:  None   1. Urinary retention, recurrent  ED Discharge Orders    None       Charlann Lange, PA-C 05/20/18 3361    Veryl Speak, MD 05/20/18 [phone]

## 2018-05-21 LAB — URINE CULTURE: Culture: NO GROWTH

## 2018-06-20 DIAGNOSIS — E1165 Type 2 diabetes mellitus with hyperglycemia: Secondary | ICD-10-CM | POA: Diagnosis not present

## 2018-06-20 DIAGNOSIS — I10 Essential (primary) hypertension: Secondary | ICD-10-CM | POA: Diagnosis not present

## 2018-06-20 DIAGNOSIS — E782 Mixed hyperlipidemia: Secondary | ICD-10-CM | POA: Diagnosis not present

## 2018-06-23 DIAGNOSIS — R972 Elevated prostate specific antigen [PSA]: Secondary | ICD-10-CM | POA: Diagnosis not present

## 2018-07-15 DIAGNOSIS — E1165 Type 2 diabetes mellitus with hyperglycemia: Secondary | ICD-10-CM | POA: Diagnosis not present

## 2018-07-15 DIAGNOSIS — E782 Mixed hyperlipidemia: Secondary | ICD-10-CM | POA: Diagnosis not present

## 2018-07-15 DIAGNOSIS — I10 Essential (primary) hypertension: Secondary | ICD-10-CM | POA: Diagnosis not present

## 2018-07-15 DIAGNOSIS — Z6829 Body mass index (BMI) 29.0-29.9, adult: Secondary | ICD-10-CM | POA: Diagnosis not present

## 2018-07-15 DIAGNOSIS — Z23 Encounter for immunization: Secondary | ICD-10-CM | POA: Diagnosis not present

## 2018-07-19 DIAGNOSIS — R339 Retention of urine, unspecified: Secondary | ICD-10-CM | POA: Diagnosis not present

## 2018-07-19 DIAGNOSIS — Z466 Encounter for fitting and adjustment of urinary device: Secondary | ICD-10-CM | POA: Diagnosis not present

## 2018-07-25 DIAGNOSIS — N401 Enlarged prostate with lower urinary tract symptoms: Secondary | ICD-10-CM | POA: Diagnosis not present

## 2018-07-25 DIAGNOSIS — R338 Other retention of urine: Secondary | ICD-10-CM | POA: Diagnosis not present

## 2018-08-04 DIAGNOSIS — E1165 Type 2 diabetes mellitus with hyperglycemia: Secondary | ICD-10-CM | POA: Diagnosis not present

## 2018-08-04 DIAGNOSIS — E119 Type 2 diabetes mellitus without complications: Secondary | ICD-10-CM | POA: Diagnosis not present

## 2018-08-04 DIAGNOSIS — Z6829 Body mass index (BMI) 29.0-29.9, adult: Secondary | ICD-10-CM | POA: Diagnosis not present

## 2018-08-04 DIAGNOSIS — J069 Acute upper respiratory infection, unspecified: Secondary | ICD-10-CM | POA: Diagnosis not present

## 2018-08-04 DIAGNOSIS — R55 Syncope and collapse: Secondary | ICD-10-CM | POA: Diagnosis not present

## 2018-08-11 DIAGNOSIS — Z7984 Long term (current) use of oral hypoglycemic drugs: Secondary | ICD-10-CM | POA: Diagnosis not present

## 2018-08-11 DIAGNOSIS — N401 Enlarged prostate with lower urinary tract symptoms: Secondary | ICD-10-CM | POA: Diagnosis not present

## 2018-08-11 DIAGNOSIS — I10 Essential (primary) hypertension: Secondary | ICD-10-CM | POA: Diagnosis not present

## 2018-08-11 DIAGNOSIS — R972 Elevated prostate specific antigen [PSA]: Secondary | ICD-10-CM | POA: Diagnosis not present

## 2018-08-11 DIAGNOSIS — R338 Other retention of urine: Secondary | ICD-10-CM | POA: Diagnosis not present

## 2018-08-11 DIAGNOSIS — E119 Type 2 diabetes mellitus without complications: Secondary | ICD-10-CM | POA: Diagnosis not present

## 2018-08-11 DIAGNOSIS — D649 Anemia, unspecified: Secondary | ICD-10-CM | POA: Diagnosis not present

## 2018-08-11 DIAGNOSIS — Z87891 Personal history of nicotine dependence: Secondary | ICD-10-CM | POA: Diagnosis not present

## 2018-08-11 DIAGNOSIS — E785 Hyperlipidemia, unspecified: Secondary | ICD-10-CM | POA: Diagnosis not present

## 2018-08-11 DIAGNOSIS — R339 Retention of urine, unspecified: Secondary | ICD-10-CM | POA: Diagnosis not present

## 2018-08-12 DIAGNOSIS — E785 Hyperlipidemia, unspecified: Secondary | ICD-10-CM | POA: Diagnosis not present

## 2018-08-12 DIAGNOSIS — E119 Type 2 diabetes mellitus without complications: Secondary | ICD-10-CM | POA: Diagnosis not present

## 2018-08-12 DIAGNOSIS — I10 Essential (primary) hypertension: Secondary | ICD-10-CM | POA: Diagnosis not present

## 2018-08-12 DIAGNOSIS — Z7984 Long term (current) use of oral hypoglycemic drugs: Secondary | ICD-10-CM | POA: Diagnosis not present

## 2018-08-12 DIAGNOSIS — N401 Enlarged prostate with lower urinary tract symptoms: Secondary | ICD-10-CM | POA: Diagnosis not present

## 2018-08-12 DIAGNOSIS — R972 Elevated prostate specific antigen [PSA]: Secondary | ICD-10-CM | POA: Diagnosis not present

## 2018-08-12 DIAGNOSIS — Z87891 Personal history of nicotine dependence: Secondary | ICD-10-CM | POA: Diagnosis not present

## 2018-08-12 DIAGNOSIS — D649 Anemia, unspecified: Secondary | ICD-10-CM | POA: Diagnosis not present

## 2018-08-12 DIAGNOSIS — R338 Other retention of urine: Secondary | ICD-10-CM | POA: Diagnosis not present

## 2018-08-16 DIAGNOSIS — R339 Retention of urine, unspecified: Secondary | ICD-10-CM | POA: Diagnosis not present

## 2018-08-16 DIAGNOSIS — Z466 Encounter for fitting and adjustment of urinary device: Secondary | ICD-10-CM | POA: Diagnosis not present

## 2018-08-29 DIAGNOSIS — E1165 Type 2 diabetes mellitus with hyperglycemia: Secondary | ICD-10-CM | POA: Diagnosis not present

## 2018-08-29 DIAGNOSIS — R55 Syncope and collapse: Secondary | ICD-10-CM | POA: Diagnosis not present

## 2018-08-29 DIAGNOSIS — M5441 Lumbago with sciatica, right side: Secondary | ICD-10-CM | POA: Diagnosis not present

## 2018-08-29 DIAGNOSIS — Z6829 Body mass index (BMI) 29.0-29.9, adult: Secondary | ICD-10-CM | POA: Diagnosis not present

## 2018-08-30 ENCOUNTER — Encounter (HOSPITAL_COMMUNITY): Payer: Self-pay | Admitting: Emergency Medicine

## 2018-08-30 ENCOUNTER — Emergency Department (HOSPITAL_COMMUNITY)
Admission: EM | Admit: 2018-08-30 | Discharge: 2018-08-30 | Disposition: A | Discharge status: Home / Self Care | Payer: BLUE CROSS/BLUE SHIELD | Attending: Emergency Medicine | Admitting: Emergency Medicine

## 2018-08-30 ENCOUNTER — Emergency Department (HOSPITAL_COMMUNITY): Payer: BLUE CROSS/BLUE SHIELD

## 2018-08-30 DIAGNOSIS — R310 Gross hematuria: Secondary | ICD-10-CM | POA: Insufficient documentation

## 2018-08-30 DIAGNOSIS — Z7982 Long term (current) use of aspirin: Secondary | ICD-10-CM | POA: Diagnosis not present

## 2018-08-30 DIAGNOSIS — E785 Hyperlipidemia, unspecified: Secondary | ICD-10-CM | POA: Diagnosis not present

## 2018-08-30 DIAGNOSIS — N183 Chronic kidney disease, stage 3 (moderate): Secondary | ICD-10-CM | POA: Diagnosis not present

## 2018-08-30 DIAGNOSIS — Z7984 Long term (current) use of oral hypoglycemic drugs: Secondary | ICD-10-CM | POA: Insufficient documentation

## 2018-08-30 DIAGNOSIS — T83091A Other mechanical complication of indwelling urethral catheter, initial encounter: Secondary | ICD-10-CM

## 2018-08-30 DIAGNOSIS — Y829 Unspecified medical devices associated with adverse incidents: Secondary | ICD-10-CM | POA: Diagnosis not present

## 2018-08-30 DIAGNOSIS — E1122 Type 2 diabetes mellitus with diabetic chronic kidney disease: Secondary | ICD-10-CM | POA: Insufficient documentation

## 2018-08-30 DIAGNOSIS — R339 Retention of urine, unspecified: Secondary | ICD-10-CM | POA: Diagnosis not present

## 2018-08-30 DIAGNOSIS — T83098A Other mechanical complication of other indwelling urethral catheter, initial encounter: Secondary | ICD-10-CM | POA: Insufficient documentation

## 2018-08-30 DIAGNOSIS — I129 Hypertensive chronic kidney disease with stage 1 through stage 4 chronic kidney disease, or unspecified chronic kidney disease: Secondary | ICD-10-CM | POA: Insufficient documentation

## 2018-08-30 DIAGNOSIS — Z79899 Other long term (current) drug therapy: Secondary | ICD-10-CM | POA: Diagnosis not present

## 2018-08-30 DIAGNOSIS — N200 Calculus of kidney: Secondary | ICD-10-CM | POA: Diagnosis not present

## 2018-08-30 LAB — CBC WITH DIFFERENTIAL/PLATELET
Abs Immature Granulocytes: 0.03 10*3/uL (ref 0.00–0.07)
BASOS ABS: 0 10*3/uL (ref 0.0–0.1)
Basophils Relative: 0 %
EOS ABS: 0 10*3/uL (ref 0.0–0.5)
EOS PCT: 0 %
HEMATOCRIT: 33 % — AB (ref 39.0–52.0)
Hemoglobin: 10.9 g/dL — ABNORMAL LOW (ref 13.0–17.0)
Immature Granulocytes: 1 %
LYMPHS ABS: 0.6 10*3/uL — AB (ref 0.7–4.0)
Lymphocytes Relative: 11 %
MCH: 30.5 pg (ref 26.0–34.0)
MCHC: 33 g/dL (ref 30.0–36.0)
MCV: 92.4 fL (ref 80.0–100.0)
MONO ABS: 0.2 10*3/uL (ref 0.1–1.0)
Monocytes Relative: 3 %
NRBC: 0 % (ref 0.0–0.2)
Neutro Abs: 5.1 10*3/uL (ref 1.7–7.7)
Neutrophils Relative %: 85 %
Platelets: 268 10*3/uL (ref 150–400)
RBC: 3.57 MIL/uL — ABNORMAL LOW (ref 4.22–5.81)
RDW: 13.8 % (ref 11.5–15.5)
WBC: 5.9 10*3/uL (ref 4.0–10.5)

## 2018-08-30 LAB — BASIC METABOLIC PANEL
Anion gap: 14 (ref 5–15)
BUN: 24 mg/dL — ABNORMAL HIGH (ref 8–23)
CALCIUM: 8.9 mg/dL (ref 8.9–10.3)
CHLORIDE: 99 mmol/L (ref 98–111)
CO2: 22 mmol/L (ref 22–32)
Creatinine, Ser: 1.46 mg/dL — ABNORMAL HIGH (ref 0.61–1.24)
GFR calc non Af Amer: 50 mL/min — ABNORMAL LOW (ref >60)
GFR, EST AFRICAN AMERICAN: 58 mL/min — AB (ref >60)
GLUCOSE: 344 mg/dL — AB (ref 70–99)
Potassium: 4.3 mmol/L (ref 3.5–5.1)
Sodium: 135 mmol/L (ref 135–145)

## 2018-08-30 LAB — CBG MONITORING, ED: Glucose-Capillary: 200 mg/dL — ABNORMAL HIGH (ref 70–99)

## 2018-08-30 MED ORDER — IOPAMIDOL (ISOVUE-300) INJECTION 61%
80.0000 mL | Freq: Once | INTRAVENOUS | Status: AC | PRN
  Administered 2018-08-30: 80 mL via INTRAVENOUS

## 2018-08-30 MED ORDER — LIDOCAINE HCL URETHRAL/MUCOSAL 2 % EX GEL
1.0000 "application " | Freq: Once | CUTANEOUS | Status: AC
  Administered 2018-08-30: 1 via TOPICAL
  Filled 2018-08-30: qty 20

## 2018-08-30 NOTE — ED Notes (Signed)
Patient transported to CT 

## 2018-08-30 NOTE — ED Notes (Addendum)
Dr. Wyvonnia Dusky advised RN to insert 3 way indwelling urinary catheter .

## 2018-08-30 NOTE — ED Notes (Signed)
Leg bag given to patient

## 2018-08-30 NOTE — ED Notes (Signed)
D/c reviewed with patient and spouse 

## 2018-08-30 NOTE — ED Provider Notes (Signed)
[facility] EMERGENCY DEPARTMENT Provider Note   CSN: [id number] Arrival date & time: 08/30/18  0403     History   Chief Complaint Chief Complaint  Patient presents with  . Urinary Retention    HPI Manuel [name] is a 63 y.o. male.  Patient presents with inability to urinate with abdominal pain since about 9 PM.  States over the past week he has been urinating small blood clots but today is not been able to urinate at all.  He underwent a "prostate shave" 2 weeks ago at [facility] and had a catheter for several days but no bleeding at that time.  Since the catheter was removed he developed some clots over the past few days.  He denies any fevers, chills, nausea, vomiting.  No testicular pain.  No change in bowel movements.  No chest pain or shortness of breath.  Takes aspirin but no other blood thinners.  The history is provided by the patient and a relative.    Past Medical History:  Diagnosis Date  . BPH (benign prostatic hyperplasia)   . Diabetes mellitus without complication ([place])   . Hyperlipidemia   . Hypertension     Patient Active Problem List   Diagnosis Date Noted  . Pyelonephritis 03/08/2018  . Sepsis ([place]) 03/08/2018  . Hydronephrosis, bilateral 03/08/2018  . Lesion of spleen 03/08/2018  . Abnormal stress test   . Chest pain 03/21/2016  . Type II diabetes mellitus with renal manifestations ([place]) 03/21/2016  . Essential hypertension 03/21/2016  . Hyperlipidemia 03/21/2016  . BPH (benign prostatic hyperplasia) 03/21/2016  . Stress at work 03/21/2016  . Acute renal failure superimposed on stage 3 chronic kidney disease ([place]) 03/21/2016    Past Surgical History:  Procedure Laterality Date  . CARDIAC CATHETERIZATION N/A 03/23/2016   Procedure: Left Heart Cath and Coronary Angiography;  Surgeon: Nelva Bush, MD;  Location: [facility];  Service: Cardiovascular;  Laterality: N/A;        Home Medications    Prior to Admission  medications   Medication Sig Start Date End Date Taking? Authorizing Provider  amLODipine (NORVASC) 10 MG tablet Take 1 tablet (10 mg total) by mouth daily. Patient not taking: Reported on 05/20/2018 03/12/18   Domenic Polite, MD  amLODipine (NORVASC) 5 MG tablet Take 5 mg by mouth daily. 05/03/18   [provider]  aspirin 81 MG chewable tablet Chew 1 tablet (81 mg total) by mouth daily. 03/24/16   Rai, Vernelle Emerald, MD  atorvastatin (LIPITOR) 10 MG tablet Take 10 mg by mouth daily. 05/10/18   [provider]  atorvastatin (LIPITOR) 80 MG tablet Take 1 tablet (80 mg total) by mouth at bedtime. Patient not taking: Reported on 05/20/2018 03/24/16   Rai, Vernelle Emerald, MD  finasteride (PROSCAR) 5 MG tablet Take 5 mg by mouth daily. 05/03/18   [provider]  glimepiride (AMARYL) 2 MG tablet Take 2 mg by mouth daily. 05/10/18   [provider]  glimepiride (AMARYL) 4 MG tablet Take 1 tablet (4 mg total) by mouth daily with breakfast. Patient not taking: Reported on 03/08/2018 03/24/16   Rai, Vernelle Emerald, MD  hydrochlorothiazide (HYDRODIURIL) 25 MG tablet Take 25 mg by mouth daily.    [provider]  metFORMIN (GLUCOPHAGE) 1000 MG tablet Take 1 tablet (1,000 mg total) by mouth 2 (two) times daily with a meal. Patient not taking: Reported on 05/20/2018 03/25/16   Mendel Corning, MD  metFORMIN (GLUCOPHAGE-XR) 500  MG 24 hr tablet Take 1,000 mg by mouth at bedtime. 05/03/18   [provider]  Meth-Hyo-M Bl-Na Phos-Ph Sal (URO-MP) 118 MG CAPS Take 1 capsule by mouth 4 (four) times daily. 05/11/18   [provider]  polyethylene glycol (MIRALAX / GLYCOLAX) packet Take 17 g by mouth daily as needed. Patient not taking: Reported on 05/20/2018 03/11/18   Domenic Polite, MD  tamsulosin (FLOMAX) 0.4 MG CAPS capsule Take 2 capsules (0.8 mg total) by mouth daily. 03/11/18   Domenic Polite, MD    Family History Family History  Problem Relation Age of Onset  .  Scleroderma Mother 17  . Congestive Heart Failure Father 39    Social History Social History   Tobacco Use  . Smoking status: Never Smoker  . Smokeless tobacco: Never Used  Substance Use Topics  . Alcohol use: No  . Drug use: No     Allergies   Patient has no known allergies.   Review of Systems Review of Systems  Constitutional: Negative for activity change, appetite change and fever.  HENT: Negative for congestion.   Respiratory: Negative for cough, chest tightness and shortness of breath.   Cardiovascular: Negative for chest pain.  Gastrointestinal: Negative for abdominal pain, nausea and vomiting.  Genitourinary: Positive for decreased urine volume, difficulty urinating and hematuria. Negative for dysuria and flank pain.  Musculoskeletal: Negative for arthralgias and myalgias.  Skin: Negative for rash.  Neurological: Negative for dizziness, weakness and headaches.   all other systems are negative except as noted in the HPI and PMH.     Physical Exam Updated Vital Signs BP 122/72 (BP Location: Right Arm)   Pulse (!) 111   Temp 97.9 F (36.6 C) (Oral)   Resp 20   Ht 5\' 7"  (1.702 m)   Wt 86.2 kg   SpO2 98%   BMI 29.76 kg/m   Physical Exam Vitals signs and nursing note reviewed.  Constitutional:      General: He is not in acute distress.    Appearance: He is well-developed.     Comments: Uncomfortable  HENT:     Head: Normocephalic and atraumatic.     Mouth/Throat:     Pharynx: No oropharyngeal exudate.  Eyes:     Conjunctiva/sclera: Conjunctivae normal.     Pupils: Pupils are equal, round, and reactive to light.  Neck:     Musculoskeletal: Normal range of motion and neck supple.     Comments: No meningismus. Cardiovascular:     Rate and Rhythm: Normal rate and regular rhythm.     Heart sounds: Normal heart sounds. No murmur.  Pulmonary:     Effort: Pulmonary effort is normal. No respiratory distress.     Breath sounds: Normal breath sounds.    Abdominal:     Palpations: Abdomen is soft.     Tenderness: There is abdominal tenderness. There is no guarding or rebound.     Comments: Palpable bladder  Genitourinary:    Comments: Blood at urethral meatus, circumcised, no testicular tenderness Musculoskeletal: Normal range of motion.        General: No tenderness.  Skin:    General: Skin is warm.  Neurological:     Mental Status: He is alert and oriented to person, place, and time.     Cranial Nerves: No cranial nerve deficit.     Motor: No abnormal muscle tone.     Coordination: Coordination normal.     Comments: No ataxia on finger to nose bilaterally. No  pronator drift. 5/5 strength throughout. CN 2-12 intact.Equal grip strength. Sensation intact.   Psychiatric:        Behavior: Behavior normal.      ED Treatments / Results  Labs (all labs ordered are listed, but only abnormal results are displayed) Labs Reviewed  CBC WITH DIFFERENTIAL/PLATELET - Abnormal; Notable for the following components:      Result Value   RBC 3.57 (*)    Hemoglobin 10.9 (*)    HCT 33.0 (*)    Lymphs Abs 0.6 (*)    All other components within normal limits  BASIC METABOLIC PANEL - Abnormal; Notable for the following components:   Glucose, Bld 344 (*)    BUN 24 (*)    Creatinine, Ser 1.46 (*)    GFR calc non Af Amer 50 (*)    GFR calc Af Amer 58 (*)    All other components within normal limits  URINE CULTURE  URINALYSIS, ROUTINE W REFLEX MICROSCOPIC    EKG None  Radiology No results found.  Procedures Procedures (including critical care time)  Medications Ordered in ED Medications  lidocaine (XYLOCAINE) 2 % jelly 1 application (has no administration in time range)     Initial Impression / Assessment and Plan / ED Course  I have reviewed the triage vital signs and the nursing notes.  Pertinent labs & imaging results that were available during my care of the patient were reviewed by me and considered in my medical decision  making (see chart for details).    Urinary retention since 9 PM.  Chart review shows patient underwent cystoscopy with TURP at [facility] on January 9.  He had a catheter for several days that was removed on January 14.   Bladder scan with greater than 400 cc of urine.  Foley catheter placed with grossly bloody urine.  The three-way catheter was placed So will irrigate.   D/w Patient's urologist at [facility] Dr. Greer Pickerel.  He agrees with hand irrigation until clearing and CBI if necessary.  Feels patient does not need transfer but may need admission for monitoring for CBI.  Hemoglobin stable.  Creatinine stable.  Foley catheter to be irrigated by nursing staff.  Patient continues to complain of abdominal pain despite having his bladder draining and is requesting CT scan.  Continue irrigate of catheter.  CT pending.  Dr. Rex Kras to assume care at shift change. Anticipate discharge home if urine clearing with irrigation and CT reassuring. May need further observation for CBI if urine is not clearing.     Final Clinical Impressions(s) / ED Diagnoses   Final diagnoses:  None    ED Discharge Orders    None       Julea Hutto, Annie Main, MD 08/30/18 [phone]

## 2018-08-30 NOTE — ED Notes (Signed)
Checked patient cbg it was 200 notifed RN of blood sugar patient is resting with call bell in reach and family at bedside

## 2018-08-30 NOTE — ED Triage Notes (Signed)
Pt reports surgery on his prostate two weeks ago, a few days ago he began to "pee blood clots" now he is unable to urinate at all.

## 2018-08-30 NOTE — Discharge Instructions (Addendum)
Keep the Foley catheter in place until you see your urologist. Return to the ED with worsening pain, fever, vomiting, or any other concerns.

## 2018-08-30 NOTE — ED Notes (Signed)
Urinary catheter irrigated with 200 ml sterile NS .

## 2018-08-30 NOTE — ED Provider Notes (Signed)
I received this patient in signout from Dr. Wyvonnia Dusky.  He had presented with obstructed Foley catheter due to gross hematuria from recent urologic procedure.  Abdominal CT showed no concerning findings, large clot noted in the bladder but resolution of hydronephrosis.  Patient received multiple rounds of bladder irrigation and on repeat exams his urine has progressively improved.  Currently it is red-tinged but clear and flowing well.  I have discussed management at home, nurse will provide with education on how to flush at home if needed.  Instructed to contact his primary urologist for follow-up appointment.  Reviewed return precautions and he voiced understanding.   Trichelle Lehan, Wenda Overland, MD 08/30/18 1356

## 2018-08-31 LAB — URINE CULTURE: CULTURE: NO GROWTH

## 2018-09-06 DIAGNOSIS — N138 Other obstructive and reflux uropathy: Secondary | ICD-10-CM | POA: Diagnosis not present

## 2018-09-06 DIAGNOSIS — Z960 Presence of urogenital implants: Secondary | ICD-10-CM | POA: Diagnosis not present

## 2018-09-06 DIAGNOSIS — N401 Enlarged prostate with lower urinary tract symptoms: Secondary | ICD-10-CM | POA: Diagnosis not present

## 2018-09-06 DIAGNOSIS — Z9079 Acquired absence of other genital organ(s): Secondary | ICD-10-CM | POA: Diagnosis not present

## 2018-09-06 DIAGNOSIS — Z48816 Encounter for surgical aftercare following surgery on the genitourinary system: Secondary | ICD-10-CM | POA: Diagnosis not present

## 2018-09-19 DIAGNOSIS — Z7984 Long term (current) use of oral hypoglycemic drugs: Secondary | ICD-10-CM | POA: Diagnosis not present

## 2018-09-19 DIAGNOSIS — Z9079 Acquired absence of other genital organ(s): Secondary | ICD-10-CM | POA: Diagnosis not present

## 2018-09-19 DIAGNOSIS — Z466 Encounter for fitting and adjustment of urinary device: Secondary | ICD-10-CM | POA: Diagnosis not present

## 2018-09-19 DIAGNOSIS — R310 Gross hematuria: Secondary | ICD-10-CM | POA: Diagnosis not present

## 2018-09-19 DIAGNOSIS — N138 Other obstructive and reflux uropathy: Secondary | ICD-10-CM | POA: Diagnosis not present

## 2018-09-19 DIAGNOSIS — N401 Enlarged prostate with lower urinary tract symptoms: Secondary | ICD-10-CM | POA: Diagnosis not present

## 2018-09-19 DIAGNOSIS — E119 Type 2 diabetes mellitus without complications: Secondary | ICD-10-CM | POA: Diagnosis not present

## 2018-09-20 DIAGNOSIS — N138 Other obstructive and reflux uropathy: Secondary | ICD-10-CM | POA: Diagnosis not present

## 2018-09-20 DIAGNOSIS — Z7984 Long term (current) use of oral hypoglycemic drugs: Secondary | ICD-10-CM | POA: Diagnosis not present

## 2018-09-20 DIAGNOSIS — N401 Enlarged prostate with lower urinary tract symptoms: Secondary | ICD-10-CM | POA: Diagnosis not present

## 2018-09-20 DIAGNOSIS — E119 Type 2 diabetes mellitus without complications: Secondary | ICD-10-CM | POA: Diagnosis not present

## 2018-09-21 DIAGNOSIS — Z7984 Long term (current) use of oral hypoglycemic drugs: Secondary | ICD-10-CM | POA: Diagnosis not present

## 2018-09-21 DIAGNOSIS — E119 Type 2 diabetes mellitus without complications: Secondary | ICD-10-CM | POA: Diagnosis not present

## 2018-09-21 DIAGNOSIS — N401 Enlarged prostate with lower urinary tract symptoms: Secondary | ICD-10-CM | POA: Diagnosis not present

## 2018-09-21 DIAGNOSIS — N138 Other obstructive and reflux uropathy: Secondary | ICD-10-CM | POA: Diagnosis not present

## 2018-09-26 DIAGNOSIS — R339 Retention of urine, unspecified: Secondary | ICD-10-CM | POA: Diagnosis not present

## 2018-09-26 DIAGNOSIS — Z466 Encounter for fitting and adjustment of urinary device: Secondary | ICD-10-CM | POA: Diagnosis not present

## 2018-10-24 DIAGNOSIS — I10 Essential (primary) hypertension: Secondary | ICD-10-CM | POA: Diagnosis not present

## 2018-10-24 DIAGNOSIS — M5431 Sciatica, right side: Secondary | ICD-10-CM | POA: Diagnosis not present

## 2018-10-24 DIAGNOSIS — E119 Type 2 diabetes mellitus without complications: Secondary | ICD-10-CM | POA: Diagnosis not present

## 2018-10-24 DIAGNOSIS — N400 Enlarged prostate without lower urinary tract symptoms: Secondary | ICD-10-CM | POA: Diagnosis not present

## 2018-11-17 DIAGNOSIS — Z7984 Long term (current) use of oral hypoglycemic drugs: Secondary | ICD-10-CM | POA: Diagnosis not present

## 2018-11-17 DIAGNOSIS — E119 Type 2 diabetes mellitus without complications: Secondary | ICD-10-CM | POA: Diagnosis not present

## 2018-11-17 DIAGNOSIS — N401 Enlarged prostate with lower urinary tract symptoms: Secondary | ICD-10-CM | POA: Diagnosis not present

## 2018-11-17 DIAGNOSIS — R338 Other retention of urine: Secondary | ICD-10-CM | POA: Diagnosis not present

## 2018-12-01 DIAGNOSIS — Z9079 Acquired absence of other genital organ(s): Secondary | ICD-10-CM | POA: Diagnosis not present

## 2018-12-01 DIAGNOSIS — I10 Essential (primary) hypertension: Secondary | ICD-10-CM | POA: Diagnosis not present

## 2018-12-01 DIAGNOSIS — E785 Hyperlipidemia, unspecified: Secondary | ICD-10-CM | POA: Diagnosis not present

## 2018-12-01 DIAGNOSIS — E119 Type 2 diabetes mellitus without complications: Secondary | ICD-10-CM | POA: Diagnosis not present

## 2019-02-09 DIAGNOSIS — Z87891 Personal history of nicotine dependence: Secondary | ICD-10-CM | POA: Diagnosis not present

## 2019-02-09 DIAGNOSIS — E785 Hyperlipidemia, unspecified: Secondary | ICD-10-CM | POA: Diagnosis not present

## 2019-02-09 DIAGNOSIS — E119 Type 2 diabetes mellitus without complications: Secondary | ICD-10-CM | POA: Diagnosis not present

## 2019-02-09 DIAGNOSIS — I10 Essential (primary) hypertension: Secondary | ICD-10-CM | POA: Diagnosis not present

## 2019-03-13 DIAGNOSIS — E782 Mixed hyperlipidemia: Secondary | ICD-10-CM | POA: Diagnosis not present

## 2019-03-13 DIAGNOSIS — I10 Essential (primary) hypertension: Secondary | ICD-10-CM | POA: Diagnosis not present

## 2019-03-13 DIAGNOSIS — Z87891 Personal history of nicotine dependence: Secondary | ICD-10-CM | POA: Diagnosis not present

## 2019-03-13 DIAGNOSIS — E119 Type 2 diabetes mellitus without complications: Secondary | ICD-10-CM | POA: Diagnosis not present

## 2019-03-16 DIAGNOSIS — H5200 Hypermetropia, unspecified eye: Secondary | ICD-10-CM | POA: Diagnosis not present

## 2019-03-16 DIAGNOSIS — H52229 Regular astigmatism, unspecified eye: Secondary | ICD-10-CM | POA: Diagnosis not present

## 2019-03-16 DIAGNOSIS — H524 Presbyopia: Secondary | ICD-10-CM | POA: Diagnosis not present

## 2019-04-11 DIAGNOSIS — R7989 Other specified abnormal findings of blood chemistry: Secondary | ICD-10-CM | POA: Diagnosis not present

## 2019-06-13 DIAGNOSIS — Z7984 Long term (current) use of oral hypoglycemic drugs: Secondary | ICD-10-CM | POA: Diagnosis not present

## 2019-06-13 DIAGNOSIS — Z23 Encounter for immunization: Secondary | ICD-10-CM | POA: Diagnosis not present

## 2019-06-13 DIAGNOSIS — I10 Essential (primary) hypertension: Secondary | ICD-10-CM | POA: Diagnosis not present

## 2019-06-13 DIAGNOSIS — E119 Type 2 diabetes mellitus without complications: Secondary | ICD-10-CM | POA: Diagnosis not present

## 2019-08-07 IMAGING — CT CT ABD-PELV W/ CM
2 of 5 series · 16 of 46 positions shown, 18 images · IV contrast (Omni 300)
Comparison: CT scan dated 03/08/2018

CLINICAL DATA: Acute abdominal pain.  Hematuria.  Recent TURP.

EXAM:
CT ABDOMEN AND PELVIS WITH CONTRAST
TECHNIQUE: Multidetector CT imaging of the abdomen and pelvis was performed
using the standard protocol following bolus administration of
intravenous contrast.
CONTRAST:  80mL LCC7EV-AWW IOPAMIDOL (LCC7EV-AWW) INJECTION 61%

[Series 3: a/p w/ 5mm · axial · 0.82mm/px · z∈[+671,+1141]mm · 13 of 106 slices shown, 15 images]
[im 6/106  soft-tissue]
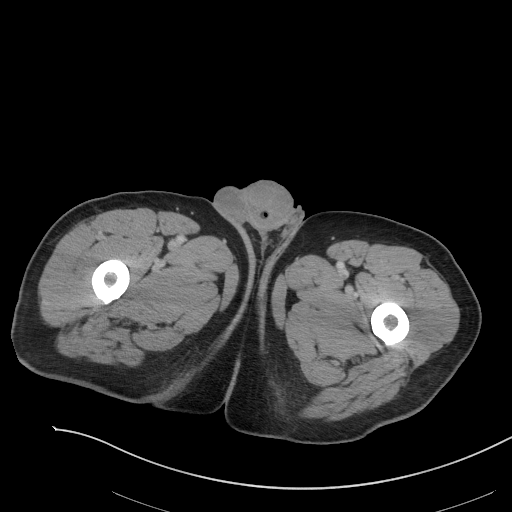
[im 6/106  bone]
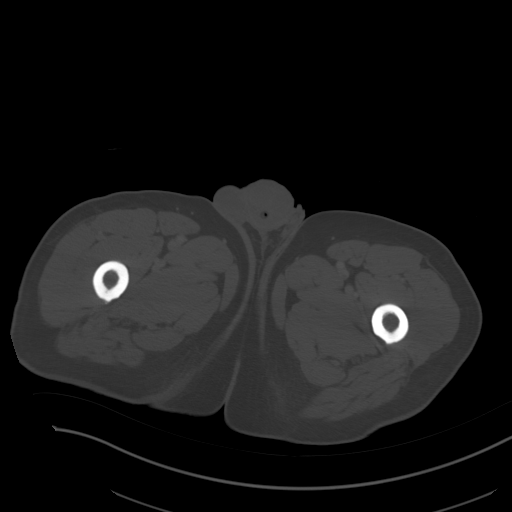
[im 17/106  soft-tissue]
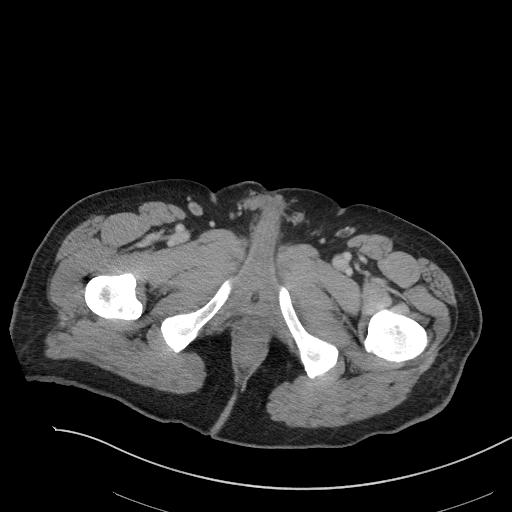
[im 23/106  soft-tissue]
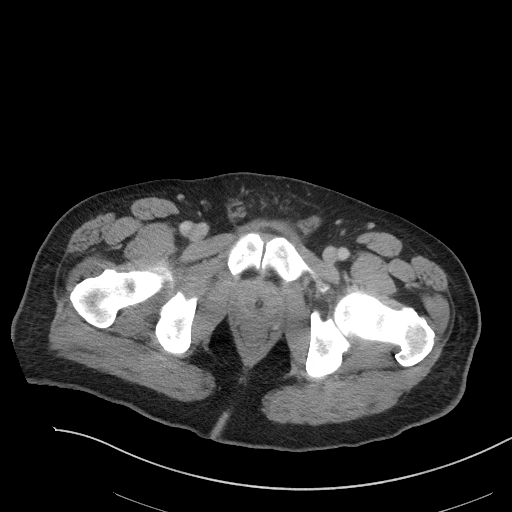
[im 28/106  soft-tissue]
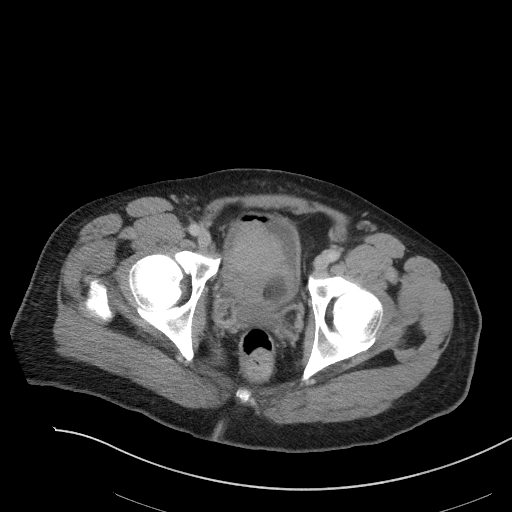
[im 39/106  soft-tissue]
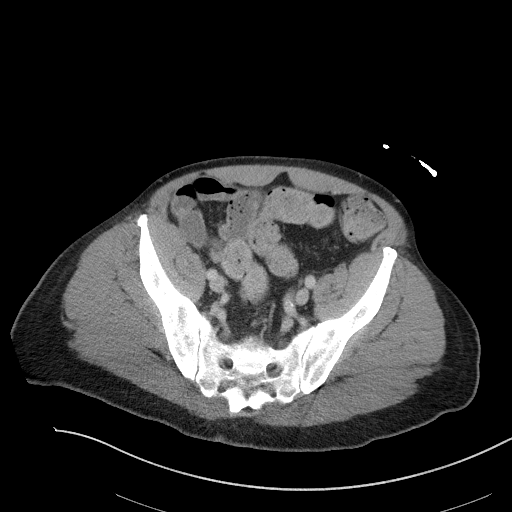
[im 45/106  soft-tissue]
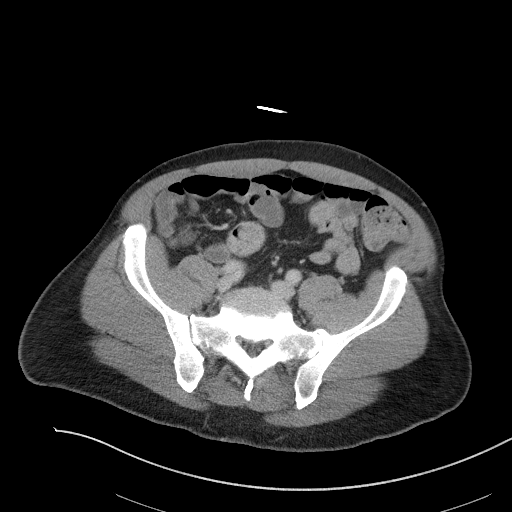
[im 56/106  soft-tissue]
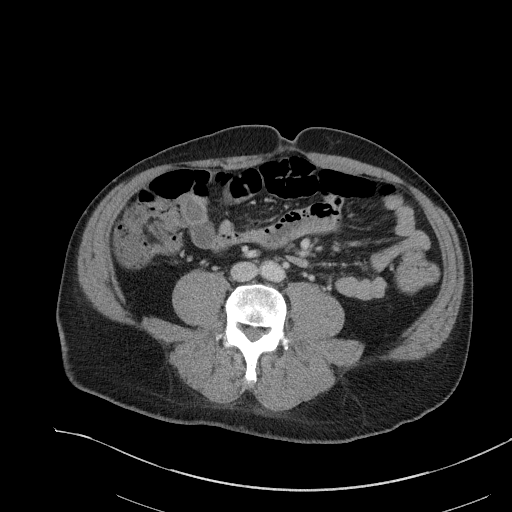
[im 61/106  soft-tissue]
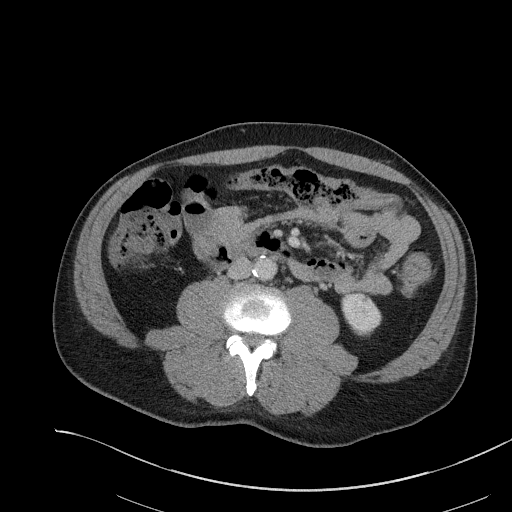
[im 67/106  soft-tissue]
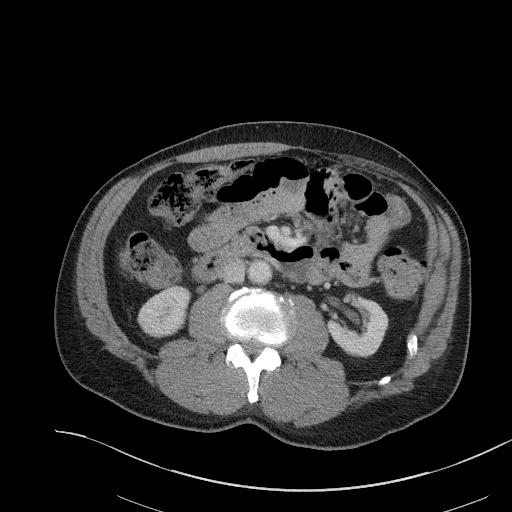
[im 67/106  bone]
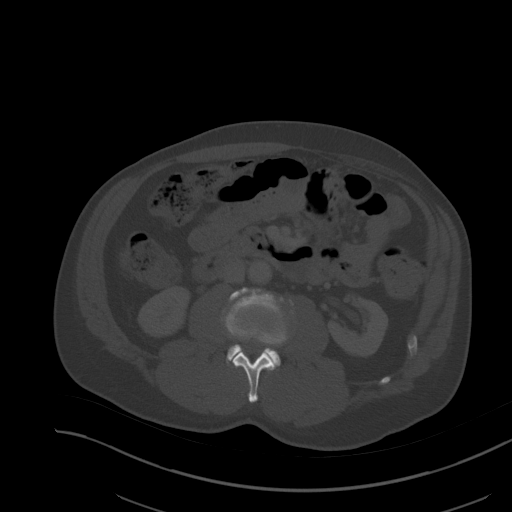
[im 78/106  soft-tissue]
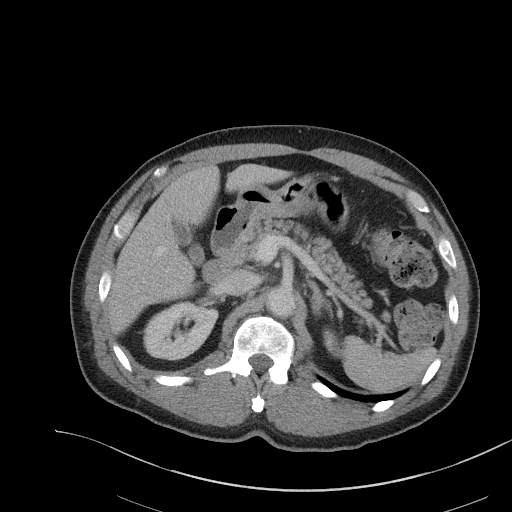
[im 83/106  soft-tissue]
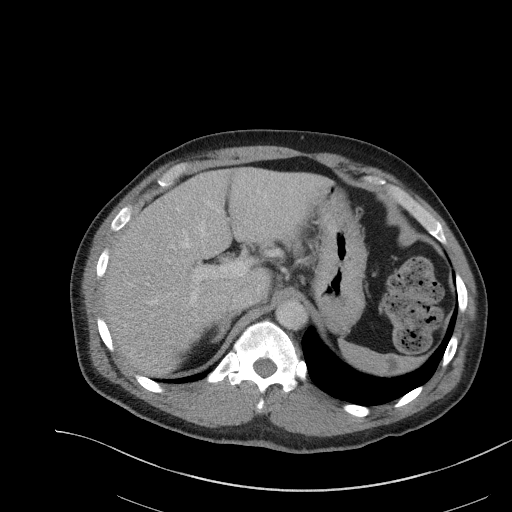
[im 89/106  soft-tissue]
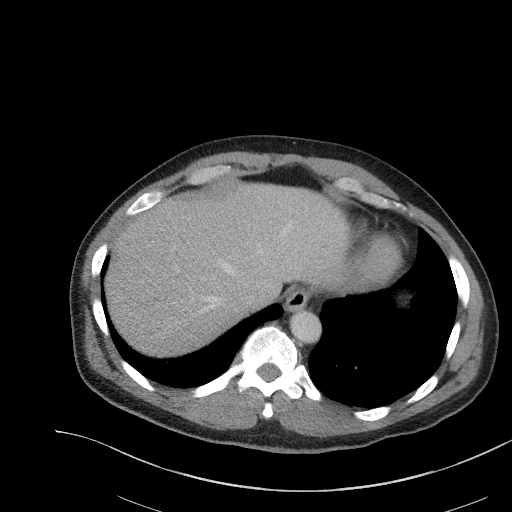
[im 100/106  soft-tissue]
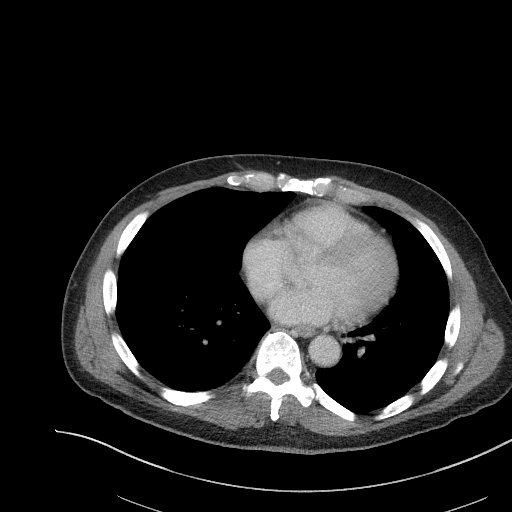

[Series 6: a/p w/ cor · coronal · 0.96mm/px · 3 of 148 slices shown]
[im 50/148  soft-tissue]
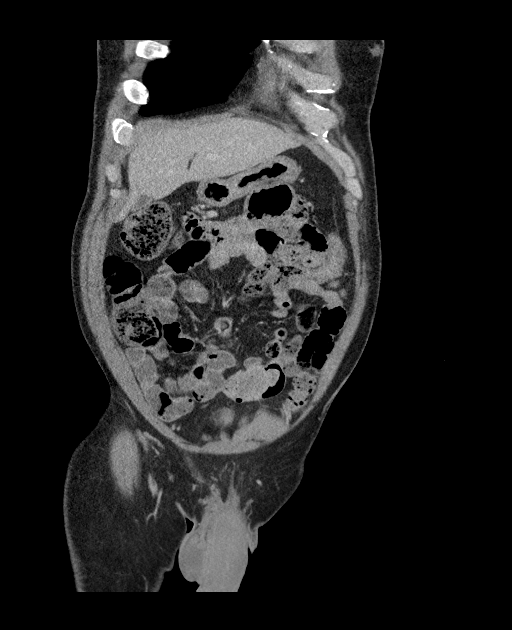
[im 66/148  soft-tissue]
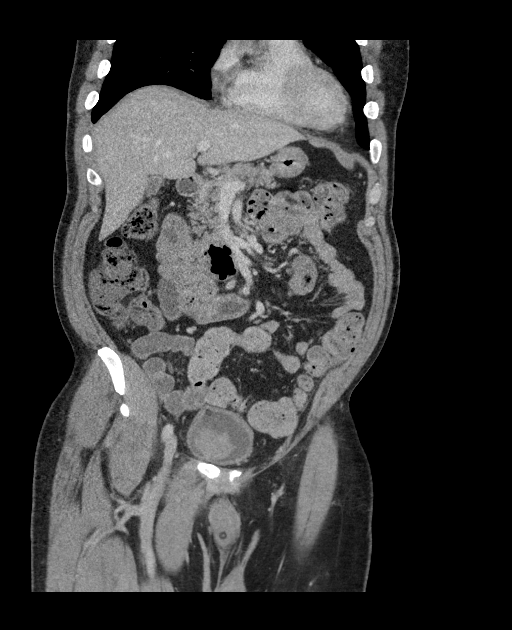
[im 82/148  soft-tissue]
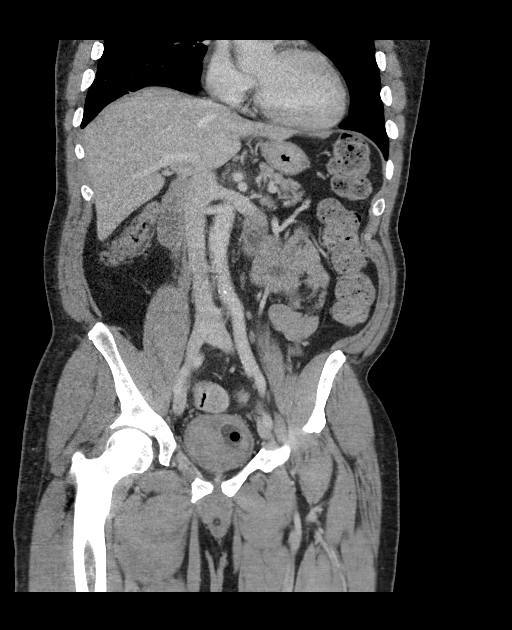

[16 of 46 positions shown; findings below may reference images not displayed]

FINDINGS: Lower chest: No acute abnormalities. Chronic interstitial disease at
the bases, stable.

Hepatobiliary: There is a tiny area of focal fatty infiltration
adjacent to the falciform ligament. The liver parenchyma is
otherwise normal. Biliary tree is normal.

Pancreas: Unremarkable. No pancreatic ductal dilatation or
surrounding inflammatory changes.

Spleen: Stable 11 mm cyst in the superior aspect of the otherwise
normal spleen.

Adrenals/Urinary Tract: Stable 10 mm cyst in the upper pole of the
left kidney. Kidneys are otherwise normal. Hydronephrosis present on
the prior study has resolved. Adrenal glands are normal. Foley
catheter in the bladder. There is a 6.5 cm clot in the bladder.
There is also a small amount of air in the bladder.

Stomach/Bowel: Stomach is within normal limits. Appendix is not
visualized. No evidence of bowel wall thickening, distention, or
inflammatory changes.

Vascular/Lymphatic: Aortic atherosclerosis. No enlarged abdominal or
pelvic lymph nodes.

Reproductive: The enlargement of the median lobe seen on the prior
study is no longer apparent.

Other: No abdominal wall hernia or abnormality. No abdominopelvic
ascites.

Musculoskeletal: No acute or significant osseous findings.
IMPRESSION: 1. There is a large blood clot in the bladder.
2. Complete resolution of the hydronephrosis seen on the prior
study.
3. Slight aortic atherosclerosis. No other significant
abnormalities.

## 2019-12-18 DIAGNOSIS — I10 Essential (primary) hypertension: Secondary | ICD-10-CM | POA: Diagnosis not present

## 2019-12-18 DIAGNOSIS — D649 Anemia, unspecified: Secondary | ICD-10-CM | POA: Diagnosis not present

## 2019-12-18 DIAGNOSIS — Z79899 Other long term (current) drug therapy: Secondary | ICD-10-CM | POA: Diagnosis not present

## 2019-12-18 DIAGNOSIS — E782 Mixed hyperlipidemia: Secondary | ICD-10-CM | POA: Diagnosis not present

## 2019-12-18 DIAGNOSIS — E119 Type 2 diabetes mellitus without complications: Secondary | ICD-10-CM | POA: Diagnosis not present

## 2019-12-18 DIAGNOSIS — Z7984 Long term (current) use of oral hypoglycemic drugs: Secondary | ICD-10-CM | POA: Diagnosis not present

## 2019-12-18 DIAGNOSIS — D508 Other iron deficiency anemias: Secondary | ICD-10-CM | POA: Diagnosis not present

## 2020-04-03 DIAGNOSIS — H34831 Tributary (branch) retinal vein occlusion, right eye, with macular edema: Secondary | ICD-10-CM | POA: Diagnosis not present

## 2020-04-03 DIAGNOSIS — H3561 Retinal hemorrhage, right eye: Secondary | ICD-10-CM | POA: Diagnosis not present

## 2020-04-03 DIAGNOSIS — E119 Type 2 diabetes mellitus without complications: Secondary | ICD-10-CM | POA: Diagnosis not present

## 2020-04-03 DIAGNOSIS — H35033 Hypertensive retinopathy, bilateral: Secondary | ICD-10-CM | POA: Diagnosis not present

## 2020-04-05 DIAGNOSIS — Z0000 Encounter for general adult medical examination without abnormal findings: Secondary | ICD-10-CM | POA: Diagnosis not present

## 2020-04-05 DIAGNOSIS — R110 Nausea: Secondary | ICD-10-CM | POA: Diagnosis not present

## 2020-04-05 DIAGNOSIS — Z125 Encounter for screening for malignant neoplasm of prostate: Secondary | ICD-10-CM | POA: Diagnosis not present

## 2020-04-05 DIAGNOSIS — E119 Type 2 diabetes mellitus without complications: Secondary | ICD-10-CM | POA: Diagnosis not present

## 2020-07-05 DIAGNOSIS — E782 Mixed hyperlipidemia: Secondary | ICD-10-CM | POA: Diagnosis not present

## 2020-07-05 DIAGNOSIS — D508 Other iron deficiency anemias: Secondary | ICD-10-CM | POA: Diagnosis not present

## 2020-07-05 DIAGNOSIS — E119 Type 2 diabetes mellitus without complications: Secondary | ICD-10-CM | POA: Diagnosis not present

## 2020-07-05 DIAGNOSIS — I10 Essential (primary) hypertension: Secondary | ICD-10-CM | POA: Diagnosis not present

## 2020-07-05 DIAGNOSIS — Z1211 Encounter for screening for malignant neoplasm of colon: Secondary | ICD-10-CM | POA: Diagnosis not present

## 2020-08-23 DIAGNOSIS — N400 Enlarged prostate without lower urinary tract symptoms: Secondary | ICD-10-CM | POA: Diagnosis not present

## 2020-08-30 DIAGNOSIS — D508 Other iron deficiency anemias: Secondary | ICD-10-CM | POA: Diagnosis not present

## 2020-08-30 DIAGNOSIS — E782 Mixed hyperlipidemia: Secondary | ICD-10-CM | POA: Diagnosis not present

## 2020-08-30 DIAGNOSIS — I10 Essential (primary) hypertension: Secondary | ICD-10-CM | POA: Diagnosis not present

## 2020-08-30 DIAGNOSIS — E119 Type 2 diabetes mellitus without complications: Secondary | ICD-10-CM | POA: Diagnosis not present

## 2020-10-22 DIAGNOSIS — H35033 Hypertensive retinopathy, bilateral: Secondary | ICD-10-CM | POA: Diagnosis not present

## 2020-10-22 DIAGNOSIS — H3581 Retinal edema: Secondary | ICD-10-CM | POA: Diagnosis not present

## 2020-10-22 DIAGNOSIS — H34831 Tributary (branch) retinal vein occlusion, right eye, with macular edema: Secondary | ICD-10-CM | POA: Diagnosis not present

## 2020-12-13 DIAGNOSIS — E119 Type 2 diabetes mellitus without complications: Secondary | ICD-10-CM | POA: Diagnosis not present

## 2020-12-13 DIAGNOSIS — I10 Essential (primary) hypertension: Secondary | ICD-10-CM | POA: Diagnosis not present

## 2020-12-13 DIAGNOSIS — D508 Other iron deficiency anemias: Secondary | ICD-10-CM | POA: Diagnosis not present

## 2020-12-13 DIAGNOSIS — E782 Mixed hyperlipidemia: Secondary | ICD-10-CM | POA: Diagnosis not present

## 2020-12-17 DIAGNOSIS — H35033 Hypertensive retinopathy, bilateral: Secondary | ICD-10-CM | POA: Diagnosis not present

## 2020-12-17 DIAGNOSIS — H34831 Tributary (branch) retinal vein occlusion, right eye, with macular edema: Secondary | ICD-10-CM | POA: Diagnosis not present

## 2020-12-17 DIAGNOSIS — I10 Essential (primary) hypertension: Secondary | ICD-10-CM | POA: Diagnosis not present

## 2020-12-17 DIAGNOSIS — H3581 Retinal edema: Secondary | ICD-10-CM | POA: Diagnosis not present

## 2021-03-18 DIAGNOSIS — E1122 Type 2 diabetes mellitus with diabetic chronic kidney disease: Secondary | ICD-10-CM | POA: Diagnosis not present

## 2021-03-18 DIAGNOSIS — N1831 Chronic kidney disease, stage 3a: Secondary | ICD-10-CM | POA: Diagnosis not present

## 2021-03-18 DIAGNOSIS — E119 Type 2 diabetes mellitus without complications: Secondary | ICD-10-CM | POA: Diagnosis not present

## 2021-03-18 DIAGNOSIS — I129 Hypertensive chronic kidney disease with stage 1 through stage 4 chronic kidney disease, or unspecified chronic kidney disease: Secondary | ICD-10-CM | POA: Diagnosis not present

## 2021-03-18 DIAGNOSIS — E782 Mixed hyperlipidemia: Secondary | ICD-10-CM | POA: Diagnosis not present

## 2021-03-18 DIAGNOSIS — E785 Hyperlipidemia, unspecified: Secondary | ICD-10-CM | POA: Diagnosis not present

## 2021-03-18 DIAGNOSIS — Z7984 Long term (current) use of oral hypoglycemic drugs: Secondary | ICD-10-CM | POA: Diagnosis not present

## 2021-07-15 DIAGNOSIS — I10 Essential (primary) hypertension: Secondary | ICD-10-CM | POA: Diagnosis not present

## 2021-07-15 DIAGNOSIS — Z87891 Personal history of nicotine dependence: Secondary | ICD-10-CM | POA: Diagnosis not present

## 2021-07-15 DIAGNOSIS — Z7984 Long term (current) use of oral hypoglycemic drugs: Secondary | ICD-10-CM | POA: Diagnosis not present

## 2021-07-15 DIAGNOSIS — Z0000 Encounter for general adult medical examination without abnormal findings: Secondary | ICD-10-CM | POA: Diagnosis not present

## 2021-07-15 DIAGNOSIS — Z125 Encounter for screening for malignant neoplasm of prostate: Secondary | ICD-10-CM | POA: Diagnosis not present

## 2021-07-15 DIAGNOSIS — E119 Type 2 diabetes mellitus without complications: Secondary | ICD-10-CM | POA: Diagnosis not present

## 2021-07-15 DIAGNOSIS — E782 Mixed hyperlipidemia: Secondary | ICD-10-CM | POA: Diagnosis not present

## 2021-11-19 ENCOUNTER — Emergency Department (HOSPITAL_BASED_OUTPATIENT_CLINIC_OR_DEPARTMENT_OTHER): Payer: PPO | Admitting: Radiology

## 2021-11-19 ENCOUNTER — Other Ambulatory Visit: Payer: Self-pay

## 2021-11-19 ENCOUNTER — Emergency Department (HOSPITAL_BASED_OUTPATIENT_CLINIC_OR_DEPARTMENT_OTHER)
Admission: EM | Admit: 2021-11-19 | Discharge: 2021-11-19 | Disposition: A | Discharge status: Home / Self Care | Payer: PPO | Attending: Emergency Medicine | Admitting: Emergency Medicine

## 2021-11-19 ENCOUNTER — Encounter (HOSPITAL_BASED_OUTPATIENT_CLINIC_OR_DEPARTMENT_OTHER): Payer: Self-pay

## 2021-11-19 DIAGNOSIS — Z7982 Long term (current) use of aspirin: Secondary | ICD-10-CM | POA: Insufficient documentation

## 2021-11-19 DIAGNOSIS — M545 Low back pain, unspecified: Secondary | ICD-10-CM | POA: Diagnosis present

## 2021-11-19 DIAGNOSIS — I10 Essential (primary) hypertension: Secondary | ICD-10-CM | POA: Diagnosis not present

## 2021-11-19 DIAGNOSIS — M5432 Sciatica, left side: Secondary | ICD-10-CM | POA: Insufficient documentation

## 2021-11-19 DIAGNOSIS — Z79899 Other long term (current) drug therapy: Secondary | ICD-10-CM | POA: Diagnosis not present

## 2021-11-19 DIAGNOSIS — M5442 Lumbago with sciatica, left side: Secondary | ICD-10-CM

## 2021-11-19 MED ORDER — PREDNISONE 50 MG PO TABS
60.0000 mg | ORAL_TABLET | Freq: Once | ORAL | Status: AC
  Administered 2021-11-19: 60 mg via ORAL
  Filled 2021-11-19: qty 1

## 2021-11-19 MED ORDER — OXYCODONE-ACETAMINOPHEN 5-325 MG PO TABS: 1.0000 | ORAL_TABLET | Freq: Four times a day (QID) | ORAL | 0 refills | Status: DC | PRN

## 2021-11-19 MED ORDER — OXYCODONE-ACETAMINOPHEN 5-325 MG PO TABS
1.0000 | ORAL_TABLET | Freq: Once | ORAL | Status: AC
  Administered 2021-11-19: 1 via ORAL
  Filled 2021-11-19: qty 1

## 2021-11-19 MED ORDER — PREDNISONE 20 MG PO TABS: ORAL_TABLET | ORAL | 0 refills | Status: AC

## 2021-11-19 NOTE — Discharge Instructions (Addendum)
It was our pleasure to provide your ER care today - we hope that you feel better. ? ?Avoid bending at waist or heavy lifting > 20 lbs for the next few days.  ? ?Take prednisone as prescribed. Take motrin or aleve as need for pain. You may also take percocet as need for pain. No driving for the next 6 hours or when taking percocet. Also, do not take tylenol or acetaminophen containing medication when taking percocet.  ? ?Follow up with primary care doctor in 1-2 weeks. Discuss physical therapy and/or spine doctor referral if symptoms fail to improve/resolve. Also have blood pressure rechecked as it is high today.  ? ?Return to ER if worse, new symptoms, fevers, numbness/weakness, intractable pain, problems with bladder function, or other concern.  ? ?

## 2021-11-19 NOTE — ED Provider Notes (Signed)
?[facility] DEPT ?Provider Note ? ? ?CSN: [id number] ?Arrival date & time: 11/19/21  2045 ? ?  ? ?History ? ?Chief Complaint  ?Patient presents with  ? Back Pain  ? ? ?Manuel [name] is a 66 y.o. male. ? ?Pt c/o left lower back pain radiating to left posterior/lat leg for the past couple weeks. Symptoms acute onset, moderate-sev, dull, radiating, worse w certain movements. No specific injury, trauma, or fall. No saddle area or leg numbness. No weakness. No problems w normal bowel or bladder fxn. No hx ddd. Denies any anterior pain, no abd or pelvic pain. No hematuria or dysuria. Has tried ultram without relief.  ? ?The history is provided by the patient, the spouse and medical records.  ?Back Pain ?Associated symptoms: no abdominal pain, no dysuria, no fever, no numbness and no weakness   ? ?  ? ?Home Medications ?Prior to Admission medications   ?Medication Sig Start Date End Date Taking? Authorizing Provider  ?amLODipine (NORVASC) 10 MG tablet Take 1 tablet (10 mg total) by mouth daily. ?Patient not taking: Reported on 05/20/2018 03/12/18   Domenic Polite, MD  ?amLODipine (NORVASC) 5 MG tablet Take 5 mg by mouth daily. 05/03/18   [provider]  ?aspirin 81 MG chewable tablet Chew 1 tablet (81 mg total) by mouth daily. 03/24/16   Rai, Vernelle Emerald, MD  ?atorvastatin (LIPITOR) 10 MG tablet Take 10 mg by mouth daily. 05/10/18   [provider]  ?atorvastatin (LIPITOR) 80 MG tablet Take 1 tablet (80 mg total) by mouth at bedtime. ?Patient not taking: Reported on 05/20/2018 03/24/16   Mendel Corning, MD  ?finasteride (PROSCAR) 5 MG tablet Take 5 mg by mouth daily. 05/03/18   [provider]  ?glimepiride (AMARYL) 2 MG tablet Take 2 mg by mouth daily. 05/10/18   [provider]  ?glimepiride (AMARYL) 4 MG tablet Take 1 tablet (4 mg total) by mouth daily with breakfast. ?Patient not taking: Reported on 03/08/2018 03/24/16   Mendel Corning, MD  ?hydrochlorothiazide  (HYDRODIURIL) 25 MG tablet Take 25 mg by mouth daily.    [provider]  ?metFORMIN (GLUCOPHAGE) 1000 MG tablet Take 1 tablet (1,000 mg total) by mouth 2 (two) times daily with a meal. ?Patient not taking: Reported on 05/20/2018 03/25/16   Rai, Vernelle Emerald, MD  ?metFORMIN (GLUCOPHAGE-XR) 500 MG 24 hr tablet Take 1,000 mg by mouth at bedtime. 05/03/18   [provider]  ?Luciana Axe Phos-Ph Sal (URO-MP) 118 MG CAPS Take 1 capsule by mouth 4 (four) times daily. 05/11/18   [provider]  ?polyethylene glycol (MIRALAX / GLYCOLAX) packet Take 17 g by mouth daily as needed. ?Patient not taking: Reported on 05/20/2018 03/11/18   Domenic Polite, MD  ?tamsulosin (FLOMAX) 0.4 MG CAPS capsule Take 2 capsules (0.8 mg total) by mouth daily. 03/11/18   Domenic Polite, MD  ?   ? ?Allergies    ?Patient has no known allergies.   ? ?Review of Systems   ?Review of Systems  ?Constitutional:  Negative for chills and fever.  ?Gastrointestinal:  Negative for abdominal pain, nausea and vomiting.  ?Genitourinary:  Negative for dysuria and hematuria.  ?Musculoskeletal:  Positive for back pain. Negative for neck pain.  ?Skin:  Negative for rash.  ?Neurological:  Negative for weakness and numbness.  ? ?Physical Exam ?Updated Vital Signs ?BP (!) 143/73 (BP Location: Left Arm)   Pulse 76   Temp 98.5 ?F (36.9 ?C)   Resp 18  Ht 1.702 m ('5\' 7"'$ )   Wt 86.2 kg   SpO2 99%   BMI 29.76 kg/m?  ?Physical Exam ?Vitals and nursing note reviewed.  ?Constitutional:   ?   Appearance: Normal appearance. He is well-developed.  ?HENT:  ?   Head: Atraumatic.  ?   Nose: Nose normal.  ?   Mouth/Throat:  ?   Mouth: Mucous membranes are moist.  ?Eyes:  ?   General: No scleral icterus. ?   Conjunctiva/sclera: Conjunctivae normal.  ?Neck:  ?   Trachea: No tracheal deviation.  ?Cardiovascular:  ?   Rate and Rhythm: Normal rate.  ?   Pulses: Normal pulses.  ?   Heart sounds:  ?  No gallop.  ?Pulmonary:  ?   Effort: Pulmonary effort is  normal. No accessory muscle usage or respiratory distress.  ?Abdominal:  ?   General: Bowel sounds are normal. There is no distension.  ?   Palpations: Abdomen is soft. There is no mass.  ?   Tenderness: There is no abdominal tenderness. There is no guarding.  ?   Comments: No pulsatile mass.   ?Genitourinary: ?   Comments: No cva tenderness. ?Musculoskeletal:     ?   General: No swelling.  ?   Cervical back: Neck supple.  ?   Comments: TLS spine non tender, aligned, no step off. Good rom left hip and knee without pain. Distal pulses palp. No LLE swelling. No skin changes, lesions or rash in area of pain.   ?Skin: ?   General: Skin is warm and dry.  ?   Findings: No rash.  ?Neurological:  ?   Mental Status: He is alert.  ?   Comments: Alert, speech clear. Motor intact LLE, stre 5/5. Sens grossly intact. Steady gait. Reflexes 2+.   ?Psychiatric:     ?   Mood and Affect: Mood normal.  ? ? ?ED Results / Procedures / Treatments   ?Labs ?(all labs ordered are listed, but only abnormal results are displayed) ?Labs Reviewed - No data to display ? ?EKG ?None ? ?Radiology ?DG Lumbar Spine Complete ? ?Result Date: 11/19/2021 ?CLINICAL DATA:  Pain. EXAM: LUMBAR SPINE - COMPLETE 4+ VIEW COMPARISON:  None. FINDINGS: There is no evidence of lumbar spine fracture. Alignment is normal. Intervertebral disc spaces are maintained. Endplate osteophytes are seen throughout the lumbar spine. Soft tissues are within normal limits. IMPRESSION: 1. No acute fracture or malalignment. 2. Mild degenerative changes. Electronically Signed   By: Ronney Asters M.D.   On: 11/19/2021 22:20   ? ?Procedures ?Procedures  ? ? ?Medications Ordered in ED ?Medications  ?predniSONE (DELTASONE) tablet 60 mg (has no administration in time range)  ?oxyCODONE-acetaminophen (PERCOCET/ROXICET) 5-325 MG per tablet 1 tablet (has no administration in time range)  ? ? ?ED Course/ Medical Decision Making/ A&P ?  ?                        ?Medical Decision  Making ?Problems Addressed: ?Acute left-sided low back pain with left-sided sciatica: acute illness or injury with systemic symptoms ?Essential hypertension: chronic illness or injury with exacerbation, progression, or side effects of treatment that poses a threat to life or bodily functions ? ?Amount and/or Complexity of Data Reviewed ?Independent Historian:  ?   Details: family/hx ?Radiology: ordered and independent interpretation performed. Decision-making details documented in ED Course. ? ?Risk ?Prescription drug management. ? ? ?Imaging ordered.  ? ?Reviewed nursing notes and prior charts for  additional history. Additional hx spouse. ? ?Percocet po. Prednisone po. ? ?Xrays reviewed/interpreted by me - no fx. ? ?Pain controlled.  ? ?Rec pcp f/u. ? ?Return precautions provided. ? ? ? ? ? ? ? ? ? ?Final Clinical Impression(s) / ED Diagnoses ?Final diagnoses:  ?None  ? ? ?Rx / DC Orders ?ED Discharge Orders   ? ? None  ? ?  ? ? ?  ?Lajean Saver, MD ?11/19/21 2239 ? ?

## 2021-11-19 NOTE — ED Notes (Signed)
Patient transported to X-ray 

## 2021-11-19 NOTE — ED Triage Notes (Signed)
Patient here POV from Home with Back Pain. ? ?Endorses Left Sided Back/Hip/Leg Pain for approximately for 2 weeks. Worsening since it began. ? ?NAD Noted during Triage. A&Ox4. GCS 15. BIB Wheelchair. ?

## 2021-11-19 NOTE — ED Notes (Signed)
Ice pack given

## 2021-11-23 ENCOUNTER — Other Ambulatory Visit: Payer: Self-pay

## 2021-11-23 ENCOUNTER — Encounter (HOSPITAL_BASED_OUTPATIENT_CLINIC_OR_DEPARTMENT_OTHER): Payer: Self-pay | Admitting: Emergency Medicine

## 2021-11-23 ENCOUNTER — Emergency Department (HOSPITAL_BASED_OUTPATIENT_CLINIC_OR_DEPARTMENT_OTHER)
Admission: EM | Admit: 2021-11-23 | Discharge: 2021-11-23 | Disposition: A | Discharge status: Home / Self Care | Payer: PPO | Attending: Emergency Medicine | Admitting: Emergency Medicine

## 2021-11-23 DIAGNOSIS — M5442 Lumbago with sciatica, left side: Secondary | ICD-10-CM | POA: Diagnosis not present

## 2021-11-23 DIAGNOSIS — Z7982 Long term (current) use of aspirin: Secondary | ICD-10-CM | POA: Diagnosis not present

## 2021-11-23 DIAGNOSIS — M5432 Sciatica, left side: Secondary | ICD-10-CM

## 2021-11-23 MED ORDER — OXYCODONE-ACETAMINOPHEN 5-325 MG PO TABS: 1.0000 | ORAL_TABLET | Freq: Four times a day (QID) | ORAL | 0 refills | Status: AC | PRN

## 2021-11-23 NOTE — ED Notes (Signed)
Pt from home with "excruciating, crippling" pain since he ran out of steroids and oxycodone that he was prescribed in April. He asks to "please get me out of this pain and I will leave you alone." Pt states he went to follow up appt and was told gabapentin was a better fit for him, but he reports that it didn't help him. Pt alert & oriented, NAD noted.  ?

## 2021-11-23 NOTE — ED Triage Notes (Signed)
Pt in with "crippling, excrutiating " pain . Patient has been treated for this,he has been treated for this for 30 days now. He ran out of prednisone and oxy and pain returned .  ?

## 2021-11-23 NOTE — ED Provider Notes (Signed)
?[facility] DE[facility] ?Provider Note ? ? ?CSN: [id number] ?Arrival date & time: 11/23/21  1445 ? ?  ? ?History ? ?Chief Complaint  ?Patient presents with  ? Hip Pain  ? ? ?COSTON MANDATO is a 66 y.o. male. ? ?Patient is a 66 year old male who presents with left side sciatica.  He says been going on for about a month but its been getting worse.  He describes pain in his left lower back going down his left leg.  He says he has some intermittent weakness and numbness in the leg but no persistent weakness and numbness.  No loss of bowel or bladder control.  No fevers.  No associate abdominal pain.  He has had some similar symptoms in the past although it has not been this bad.  He has been seen by his primary care provider and recently seen in the emergency department for similar symptoms on April 19.  He has been treated with 2 courses of steroids, initially a Medrol Dosepak started on April 14 and then a course of prednisone on April 19.  He said most recently with the prednisone and the Percocet, his pain was under control but since he stopped it, the pain is come back.  He does not yet have a referral to an orthopedist or spine specialist. ? ? ?  ? ?Home Medications ?Prior to Admission medications   ?Medication Sig Start Date End Date Taking? Authorizing Provider  ?oxyCODONE-acetaminophen (PERCOCET/ROXICET) 5-325 MG tablet Take 1-2 tablets by mouth every 6 (six) hours as needed for severe pain. 11/23/21  Yes Malvin Johns, MD  ?amLODipine (NORVASC) 10 MG tablet Take 1 tablet (10 mg total) by mouth daily. ?Patient not taking: Reported on 05/20/2018 03/12/18   Domenic Polite, MD  ?amLODipine (NORVASC) 5 MG tablet Take 5 mg by mouth daily. 05/03/18   [provider]  ?aspirin 81 MG chewable tablet Chew 1 tablet (81 mg total) by mouth daily. 03/24/16   Rai, Vernelle Emerald, MD  ?atorvastatin (LIPITOR) 10 MG tablet Take 10 mg by mouth daily. 05/10/18   [provider]  ?atorvastatin (LIPITOR)  80 MG tablet Take 1 tablet (80 mg total) by mouth at bedtime. ?Patient not taking: Reported on 05/20/2018 03/24/16   Mendel Corning, MD  ?finasteride (PROSCAR) 5 MG tablet Take 5 mg by mouth daily. 05/03/18   [provider]  ?glimepiride (AMARYL) 2 MG tablet Take 2 mg by mouth daily. 05/10/18   [provider]  ?glimepiride (AMARYL) 4 MG tablet Take 1 tablet (4 mg total) by mouth daily with breakfast. ?Patient not taking: Reported on 03/08/2018 03/24/16   Mendel Corning, MD  ?hydrochlorothiazide (HYDRODIURIL) 25 MG tablet Take 25 mg by mouth daily.    [provider]  ?metFORMIN (GLUCOPHAGE) 1000 MG tablet Take 1 tablet (1,000 mg total) by mouth 2 (two) times daily with a meal. ?Patient not taking: Reported on 05/20/2018 03/25/16   Rai, Vernelle Emerald, MD  ?metFORMIN (GLUCOPHAGE-XR) 500 MG 24 hr tablet Take 1,000 mg by mouth at bedtime. 05/03/18   [provider]  ?Luciana Axe Phos-Ph Sal (URO-MP) 118 MG CAPS Take 1 capsule by mouth 4 (four) times daily. 05/11/18   [provider]  ?polyethylene glycol (MIRALAX / GLYCOLAX) packet Take 17 g by mouth daily as needed. ?Patient not taking: Reported on 05/20/2018 03/11/18   Domenic Polite, MD  ?predniSONE (DELTASONE) 20 MG tablet 3 po once a day for 2 days, then 2 po once a day  for 3 days, then 1 po once a day for 3 days 11/20/21   Lajean Saver, MD  ?tamsulosin (FLOMAX) 0.4 MG CAPS capsule Take 2 capsules (0.8 mg total) by mouth daily. 03/11/18   Domenic Polite, MD  ?   ? ?Allergies    ?Patient has no known allergies.   ? ?Review of Systems   ?Review of Systems  ?Constitutional:  Negative for chills, diaphoresis, fatigue and fever.  ?HENT:  Negative for congestion, rhinorrhea and sneezing.   ?Eyes: Negative.   ?Respiratory:  Negative for cough, chest tightness and shortness of breath.   ?Cardiovascular:  Negative for chest pain and leg swelling.  ?Gastrointestinal:  Negative for abdominal pain, blood in stool, diarrhea, nausea and  vomiting.  ?Genitourinary:  Negative for difficulty urinating, flank pain, frequency and hematuria.  ?Musculoskeletal:  Positive for back pain. Negative for arthralgias.  ?Skin:  Negative for rash.  ?Neurological:  Positive for weakness and numbness. Negative for dizziness, speech difficulty and headaches.  ? ?Physical Exam ?Updated Vital Signs ?BP (!) 151/77 (BP Location: Right Arm)   Pulse 70   Temp 99 ?F (37.2 ?C)   Resp 20   SpO2 98%  ?Physical Exam ?Constitutional:   ?   Appearance: He is well-developed.  ?HENT:  ?   Head: Normocephalic and atraumatic.  ?Eyes:  ?   Pupils: Pupils are equal, round, and reactive to light.  ?Cardiovascular:  ?   Rate and Rhythm: Normal rate and regular rhythm.  ?   Heart sounds: Normal heart sounds.  ?Pulmonary:  ?   Effort: Pulmonary effort is normal. No respiratory distress.  ?   Breath sounds: Normal breath sounds. No wheezing or rales.  ?Chest:  ?   Chest wall: No tenderness.  ?Abdominal:  ?   General: Bowel sounds are normal.  ?   Palpations: Abdomen is soft.  ?   Tenderness: There is no abdominal tenderness. There is no guarding or rebound.  ?Musculoskeletal:     ?   General: Normal range of motion.  ?   Cervical back: Normal range of motion and neck supple.  ?   Comments: Positive tenderness in the left lower back and over the left sciatic nerve.  Negative straight leg raise bilaterally.  Patellar reflexes symmetric bilaterally.  He has normal sensation and motor function to lower extremities.  Pedal pulses are intact.  ?Lymphadenopathy:  ?   Cervical: No cervical adenopathy.  ?Skin: ?   General: Skin is warm and dry.  ?   Findings: No rash.  ?Neurological:  ?   Mental Status: He is alert and oriented to person, place, and time.  ? ? ?ED Results / Procedures / Treatments   ?Labs ?(all labs ordered are listed, but only abnormal results are displayed) ?Labs Reviewed - No data to display ? ?EKG ?None ? ?Radiology ?No results found. ? ?Procedures ?Procedures   ? ? ?Medications Ordered in ED ?Medications - No data to display ? ?ED Course/ Medical Decision Making/ A&P ?  ?                        ?Medical Decision Making ?Risk ?Prescription drug management. ? ? ?Patient presents with left-sided sciatica.  He does not have any neurologic dysfunction or signs of cauda equina.  I reviewed his chart and he had recent x-rays of his lumbar spine which showed no acute abnormality.  He has taken 2 courses of steroids and I did not  feel that it be beneficial to continue steroids.  He is requesting a short course of Percocet again until he can follow-up with a specialist.  I did review the substance database and he has not had any other prescriptions other than the recent Percocet and a tramadol prescription.  I do feel comfortable giving him 1 more course of Percocet.  I will give him a referral to follow-up with an orthopedist.  Return precautions were given. ? ?Final Clinical Impression(s) / ED Diagnoses ?Final diagnoses:  ?Left sided sciatica  ? ? ?Rx / DC Orders ?ED Discharge Orders   ? ?      Ordered  ?  oxyCODONE-acetaminophen (PERCOCET/ROXICET) 5-325 MG tablet  Every 6 hours PRN       ? 11/23/21 2038  ? ?  ?  ? ?  ? ? ?  ?Malvin Johns, MD ?11/23/21 2042 ? ?

## 2021-11-23 NOTE — ED Notes (Signed)
ED Provider at bedside. 

## 2021-11-24 ENCOUNTER — Other Ambulatory Visit (HOSPITAL_BASED_OUTPATIENT_CLINIC_OR_DEPARTMENT_OTHER): Payer: Self-pay

## 2021-11-26 ENCOUNTER — Other Ambulatory Visit (HOSPITAL_BASED_OUTPATIENT_CLINIC_OR_DEPARTMENT_OTHER): Payer: Self-pay | Admitting: Orthopaedic Surgery

## 2021-11-26 ENCOUNTER — Other Ambulatory Visit (HOSPITAL_BASED_OUTPATIENT_CLINIC_OR_DEPARTMENT_OTHER): Payer: Self-pay

## 2021-11-26 ENCOUNTER — Ambulatory Visit (INDEPENDENT_AMBULATORY_CARE_PROVIDER_SITE_OTHER): Payer: PPO | Admitting: Orthopaedic Surgery

## 2021-11-26 DIAGNOSIS — M5416 Radiculopathy, lumbar region: Secondary | ICD-10-CM

## 2021-11-26 DIAGNOSIS — M545 Low back pain, unspecified: Secondary | ICD-10-CM

## 2021-11-26 MED ORDER — METHOCARBAMOL 500 MG PO TABS
500.0000 mg | ORAL_TABLET | Freq: Two times a day (BID) | ORAL | 3 refills | Status: DC
  Filled 2021-11-26: qty 30, 15d supply, fill #0
  Filled 2021-12-02: qty 30, 15d supply, fill #1

## 2021-11-26 NOTE — Addendum Note (Signed)
Addended by: Yevonne Pax on: 11/26/2021 01:28 PM ? ? Modules accepted: Orders ? ?

## 2021-11-26 NOTE — Progress Notes (Signed)
? ?                            ? ? ?Chief Complaint: Lower back pain with radiation ?  ? ? ?History of Present Illness:  ? ? ?Manuel [name] is a 66 y.o. male presents with lower back pain which has now been going on for greater than a month.  This is radiating down the left leg.  Denies any changes in bowel or bladder.  He has gone to the emergency room twice which has resulted in prescriptions for Medrol as well as prednisone and Percocet.  He is unfortunately not tolerating of the steroids very well as he is diabetic and this has been raising his blood sugar.  He has been now walking with a cane as his pain and weakness is progressing.  He does not have any history of diabetic neuropathy.  He has been using heat which helps.  He does endorse weakness on the left side.  He works as a Investment banker, operational here in [place] ? ? ? ?Surgical History:   ?None ? ?PMH/PSH/Family History/Social History/Meds/Allergies:   ? ?Past Medical History:  ?Diagnosis Date  ? BPH (benign prostatic hyperplasia)   ? Diabetes mellitus without complication ([place])   ? Hyperlipidemia   ? Hypertension   ? ?Past Surgical History:  ?Procedure Laterality Date  ? CARDIAC CATHETERIZATION N/A 03/23/2016  ? Procedure: Left Heart Cath and Coronary Angiography;  Surgeon: Nelva Bush, MD;  Location: [facility];  Service: Cardiovascular;  Laterality: N/A;  ? ?Social History  ? ?Socioeconomic History  ? Marital status: Married  ?  Spouse name: Not on file  ? Number of children: Not on file  ? Years of education: Not on file  ? Highest education level: Not on file  ?Occupational History  ? Occupation: psychotherapist  ?Tobacco Use  ? Smoking status: Never  ? Smokeless tobacco: Never  ?Substance and Sexual Activity  ? Alcohol use: No  ? Drug use: No  ? Sexual activity: Not on file  ?Other Topics Concern  ? Not on file  ?Social History Narrative  ? Not on file  ? ?Social Determinants of Health  ? ?Financial Resource Strain: Not on file  ?Food  Insecurity: Not on file  ?Transportation Needs: Not on file  ?Physical Activity: Not on file  ?Stress: Not on file  ?Social Connections: Not on file  ? ?Family History  ?Problem Relation Age of Onset  ? Scleroderma Mother 68  ? Congestive Heart Failure Father 57  ? ?No Known Allergies ?Current Outpatient Medications  ?Medication Sig Dispense Refill  ? amLODipine (NORVASC) 10 MG tablet Take 1 tablet (10 mg total) by mouth daily. (Patient not taking: Reported on 05/20/2018) 30 tablet 0  ? amLODipine (NORVASC) 5 MG tablet Take 5 mg by mouth daily.  3  ? aspirin 81 MG chewable tablet Chew 1 tablet (81 mg total) by mouth daily. 30 tablet 3  ? atorvastatin (LIPITOR) 10 MG tablet Take 10 mg by mouth daily.  0  ? atorvastatin (LIPITOR) 80 MG tablet Take 1 tablet (80 mg total) by mouth at bedtime. (Patient not taking: Reported on 05/20/2018) 30 tablet 3  ? finasteride (PROSCAR) 5 MG tablet Take 5 mg by mouth daily.  3  ? glimepiride (AMARYL) 2 MG tablet Take 2 mg by mouth daily.  5  ? glimepiride (AMARYL) 4 MG tablet Take 1 tablet (4 mg total) by  mouth daily with breakfast. (Patient not taking: Reported on 03/08/2018) 30 tablet 3  ? hydrochlorothiazide (HYDRODIURIL) 25 MG tablet Take 25 mg by mouth daily.    ? metFORMIN (GLUCOPHAGE) 1000 MG tablet Take 1 tablet (1,000 mg total) by mouth 2 (two) times daily with a meal. (Patient not taking: Reported on 05/20/2018) 60 tablet 3  ? metFORMIN (GLUCOPHAGE-XR) 500 MG 24 hr tablet Take 1,000 mg by mouth at bedtime.  5  ? Meth-Hyo-M Bl-Na Phos-Ph Sal (URO-MP) 118 MG CAPS Take 1 capsule by mouth 4 (four) times daily.  0  ? oxyCODONE-acetaminophen (PERCOCET/ROXICET) 5-325 MG tablet Take 1-2 tablets by mouth every 6 (six) hours as needed for severe pain. 15 tablet 0  ? polyethylene glycol (MIRALAX / GLYCOLAX) packet Take 17 g by mouth daily as needed. (Patient not taking: Reported on 05/20/2018) 7 each 0  ? predniSONE (DELTASONE) 20 MG tablet 3 po once a day for 2 days, then 2 po once a  day for 3 days, then 1 po once a day for 3 days 15 tablet 0  ? tamsulosin (FLOMAX) 0.4 MG CAPS capsule Take 2 capsules (0.8 mg total) by mouth daily. 30 capsule 0  ? ?No current facility-administered medications for this visit.  ? ?No results found. ? ?Review of Systems:   ?A ROS was performed including pertinent positives and negatives as documented in the HPI. ? ?Physical Exam :   ?Constitutional: NAD and appears stated age ?Neurological: Alert and oriented ?Psych: Appropriate affect and cooperative ?There were no vitals taken for this visit.  ? ?Comprehensive Musculoskeletal Exam:   ? ?He has tenderness about the lumbar spine.  There is radiation down the left leg.  There is weakness with knee flexion and knee extension.  This is somewhat limited by pain.  Positive straight leg raise.  Rest of the neurosensory exam is intact ? ?Imaging:   ?Xray (4 views lumbar spine): ?Multilevel degenerative disc disease ? ? ?I personally reviewed and interpreted the radiographs. ? ? ?Assessment:   ?66 y.o. male with lumbar back pain and radiation consistent with lumbar radiculitis.  Has been recalcitrant to multiple different medications, I have recommended an MRI to rule out a disc herniation which could be the etiology of his pain.  I did describe that would help to localize for any type of steroid injection as well.  We will plan to proceed with this.  I will also send a prescription for Robaxin to hopefully get him some relief in the meantime ? ?Plan :   ? ?-Plan for MRI lumbar spine and follow-up ? ? ? ? ?I personally saw and evaluated the patient, and participated in the management and treatment plan. ? ?Vanetta Mulders, MD ?Attending Physician, Orthopedic Surgery ? ?This document was dictated using Systems analyst. A reasonable attempt at proof reading has been made to minimize errors. ?

## 2021-11-28 ENCOUNTER — Ambulatory Visit
Admission: RE | Admit: 2021-11-28 | Discharge: 2021-11-28 | Disposition: A | Discharge status: Home / Self Care | Payer: PPO | Source: Ambulatory Visit | Attending: Orthopaedic Surgery | Admitting: Orthopaedic Surgery

## 2021-11-28 DIAGNOSIS — G8929 Other chronic pain: Secondary | ICD-10-CM

## 2021-12-02 ENCOUNTER — Other Ambulatory Visit (HOSPITAL_BASED_OUTPATIENT_CLINIC_OR_DEPARTMENT_OTHER): Payer: Self-pay

## 2021-12-03 ENCOUNTER — Other Ambulatory Visit (HOSPITAL_BASED_OUTPATIENT_CLINIC_OR_DEPARTMENT_OTHER): Payer: Self-pay | Admitting: Orthopaedic Surgery

## 2021-12-03 ENCOUNTER — Telehealth: Payer: Self-pay | Admitting: Orthopaedic Surgery

## 2021-12-03 MED ORDER — METHOCARBAMOL 500 MG PO TABS: 1000.0000 mg | ORAL_TABLET | Freq: Two times a day (BID) | ORAL | 3 refills | Status: DC

## 2021-12-03 NOTE — Telephone Encounter (Signed)
Pt called and was wondering what his mri results are. He states he is in SO much pain.  ? ?CB [phone]  ?

## 2021-12-03 NOTE — Telephone Encounter (Signed)
Just sent stronger robaxin ?

## 2021-12-03 NOTE — Telephone Encounter (Signed)
Patient is requesting a stronger dose of the muscle relaxer Robaxin or another medication to help with pain  ?

## 2021-12-08 ENCOUNTER — Ambulatory Visit (HOSPITAL_BASED_OUTPATIENT_CLINIC_OR_DEPARTMENT_OTHER): Payer: PRIVATE HEALTH INSURANCE | Admitting: Orthopaedic Surgery

## 2021-12-11 ENCOUNTER — Ambulatory Visit (HOSPITAL_BASED_OUTPATIENT_CLINIC_OR_DEPARTMENT_OTHER): Payer: PRIVATE HEALTH INSURANCE | Admitting: Orthopaedic Surgery

## 2022-01-26 ENCOUNTER — Other Ambulatory Visit (HOSPITAL_BASED_OUTPATIENT_CLINIC_OR_DEPARTMENT_OTHER): Payer: Self-pay | Admitting: Orthopaedic Surgery

## 2022-03-28 ENCOUNTER — Other Ambulatory Visit (HOSPITAL_BASED_OUTPATIENT_CLINIC_OR_DEPARTMENT_OTHER): Payer: Self-pay | Admitting: Orthopaedic Surgery

## 2022-04-22 ENCOUNTER — Encounter: Payer: Self-pay | Admitting: *Deleted

## 2022-04-22 ENCOUNTER — Telehealth: Payer: Self-pay | Admitting: *Deleted

## 2022-04-22 NOTE — Patient Outreach (Signed)
  Care Coordination   Initial Visit Note   04/22/2022 Name: Manuel [name] MRN: [id number] DOB: 10-29-1955  Manuel [name] is a 66 y.o. year old male who sees Andria Frames, [place] for primary care. I spoke with  Manuel Botts. [place] by phone today.  What matters to the patients health and wellness today?  "need [employer] to cover physical therapy"    Goals Addressed               This Visit's Progress     COMPLETED: "need [employer] to cover physical therapy" (pt-stated)        Care Coordination Interventions: Patient interviewed about adult health maintenance status including  importance of yearly Annual Wellness Visit Provided education about importance of taking medications as prescribed Patient reports " I don't need anything but just to make sure HTA will cover my physical therapy visits"  Pt denies pain today, states he has had low back issues and sees a chiropractor, has all medications and taking as prescribed. Patient states he is not concerned about DM or HTN and feels he is managing well RN care manager ask pt to call number on back of [employer] card and speak with conciegre service for list of in network providers, pt states he is able to do this and get a referral from his doctor, states he will call RN care coordinator back if any further needs. Explained care coordination program, pt agreeable to today's call but not interested in any further follow up         SDOH assessments and interventions completed:  Yes  SDOH Interventions Today    Flowsheet Row Most Recent Value  SDOH Interventions   Food Insecurity Interventions Intervention Not Indicated  Transportation Interventions Intervention Not Indicated        Care Coordination Interventions Activated:  Yes  Care Coordination Interventions:  Yes, provided   Follow up plan: No further intervention required.   Encounter Outcome:  Pt. Visit Completed   Jacqlyn Larsen  [facility], BSN [facility] RN Care Coordinator [phone]

## 2022-06-22 ENCOUNTER — Other Ambulatory Visit (HOSPITAL_BASED_OUTPATIENT_CLINIC_OR_DEPARTMENT_OTHER): Payer: Self-pay | Admitting: Orthopaedic Surgery

## 2022-08-09 ENCOUNTER — Other Ambulatory Visit (HOSPITAL_BASED_OUTPATIENT_CLINIC_OR_DEPARTMENT_OTHER): Payer: Self-pay | Admitting: Orthopaedic Surgery

## 2022-09-26 ENCOUNTER — Other Ambulatory Visit (HOSPITAL_BASED_OUTPATIENT_CLINIC_OR_DEPARTMENT_OTHER): Payer: Self-pay | Admitting: Orthopaedic Surgery

## 2022-11-05 IMAGING — MR MR LUMBAR SPINE W/O CM
4 of 5 series · 27 of 48 positions shown · non-contrast
Comparison: None.

CLINICAL DATA: Low back pain, spondyloarthropathy suspected, xray
done

EXAM:
MRI LUMBAR SPINE WITHOUT CONTRAST
TECHNIQUE: Multiplanar, multisequence MR imaging of the lumbar spine was
performed. No intravenous contrast was administered.

[Series 3: T2 · sagittal · 4.0mm · 1.09mm/px · 6 of 17 slices shown (1 of 2)]
[im 1/17]
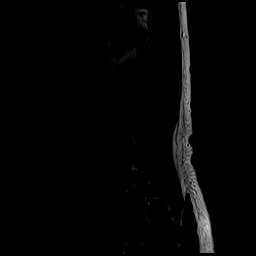
[im 4/17]
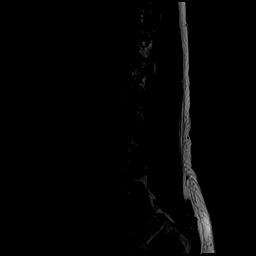
[im 7/17]
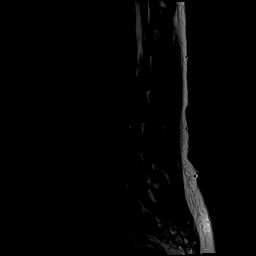
[im 10/17]
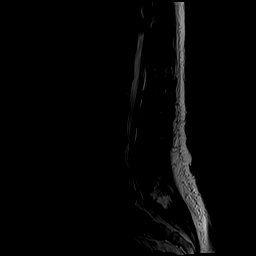
[im 13/17]
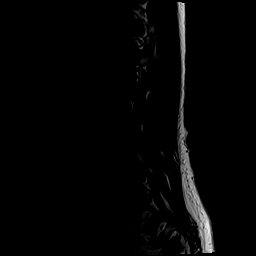
[im 17/17]
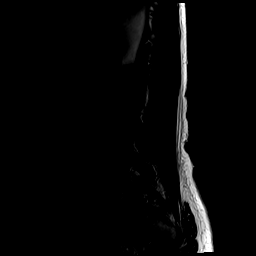

[Series 5: T1 · sagittal · 4.0mm · 1.09mm/px · 6 of 17 slices shown (1 of 2)]
[im 1/17]
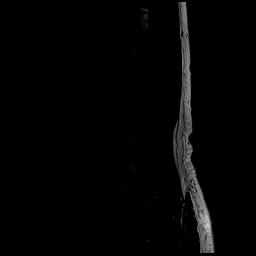
[im 4/17]
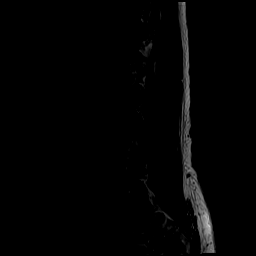
[im 7/17]
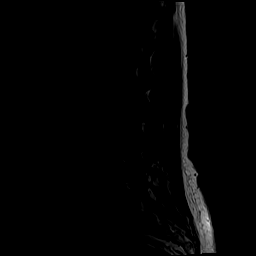
[im 10/17]
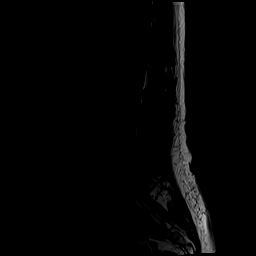
[im 13/17]
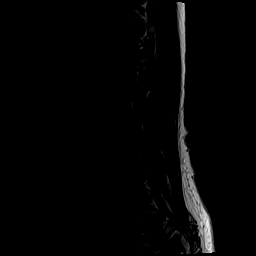
[im 17/17]
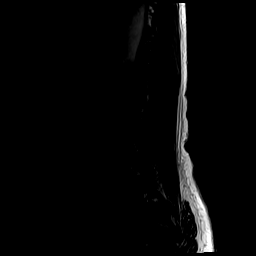

[Series 6: T2 · axial · 4.0mm · 0.39mm/px · z∈[-19,+206]mm · 9 of 44 slices shown (2 of 2)]
[im 1/44]
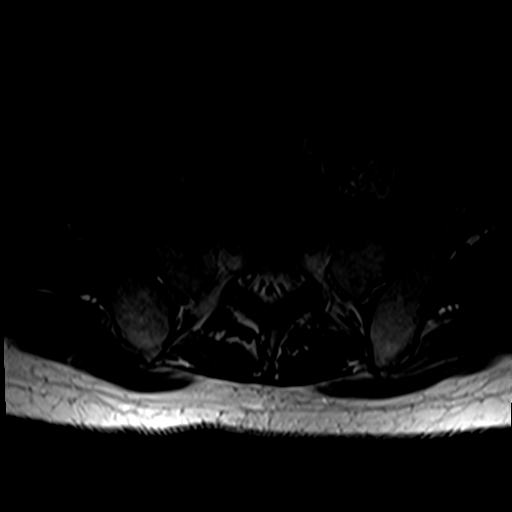
[im 7/44]
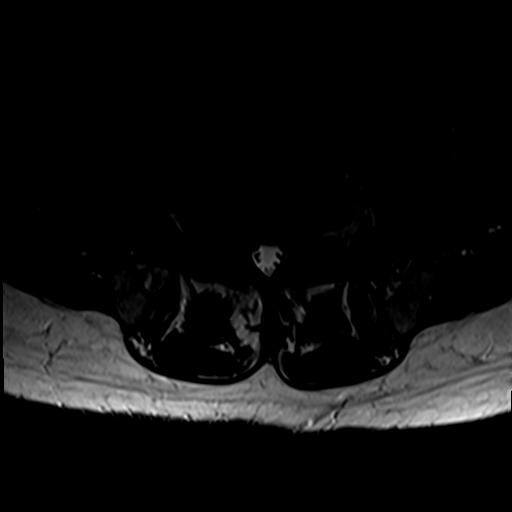
[im 13/44]
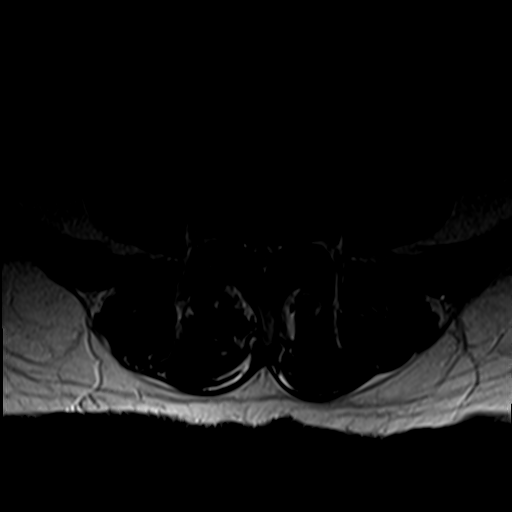
[im 19/44]
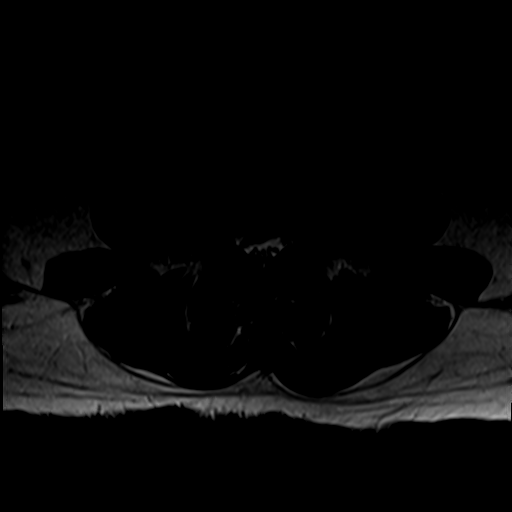
[im 22/44]
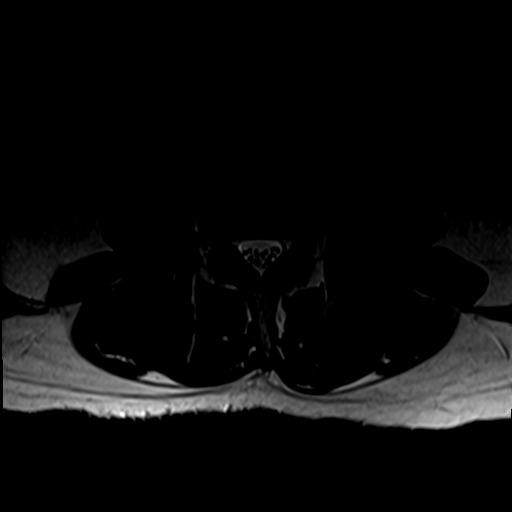
[im 25/44]
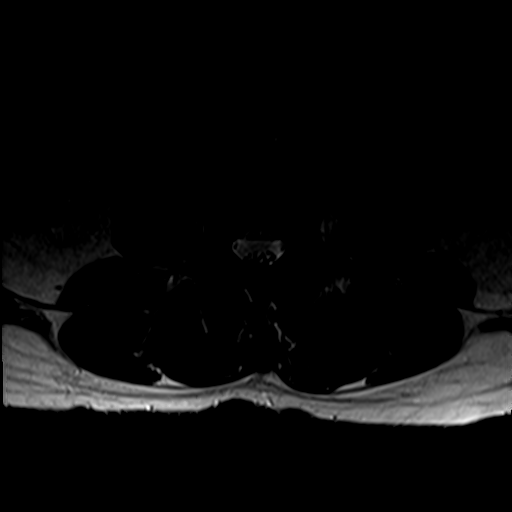
[im 31/44]
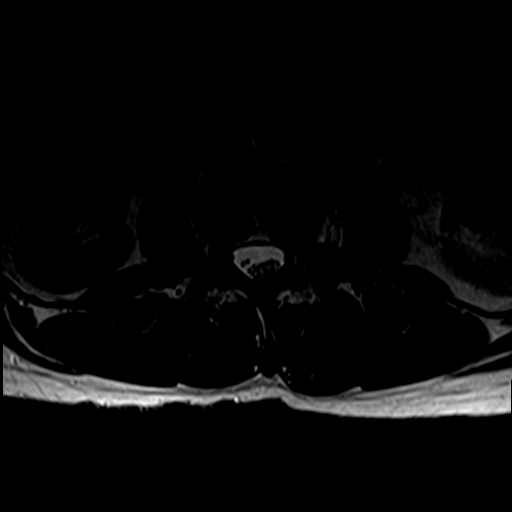
[im 37/44]
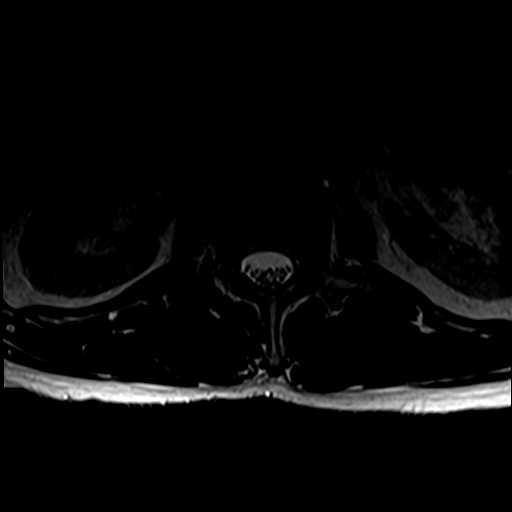
[im 44/44]
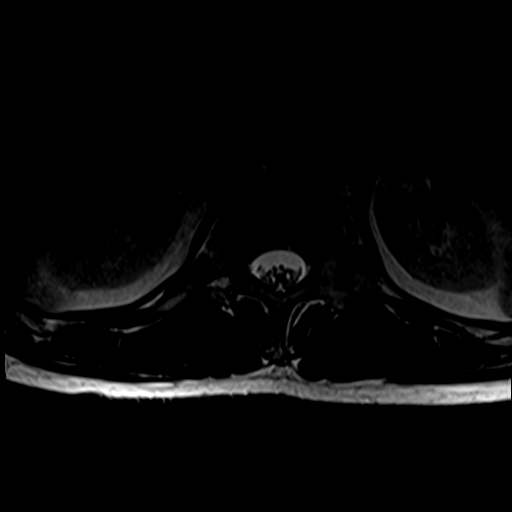

[Series 7: T1 · axial · 4.0mm · 0.39mm/px · z∈[-19,+173]mm · 6 of 44 slices shown (2 of 2)]
[im 1/44]
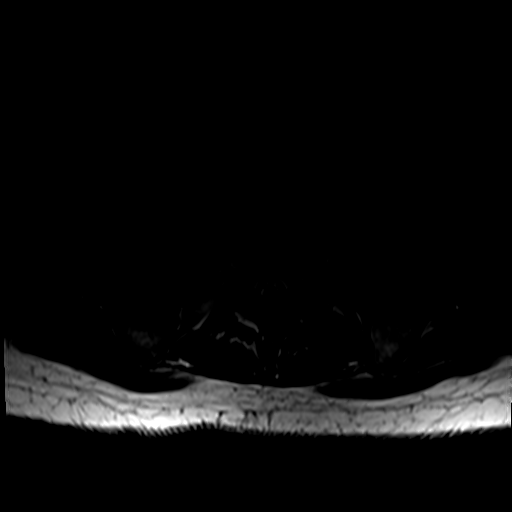
[im 7/44]
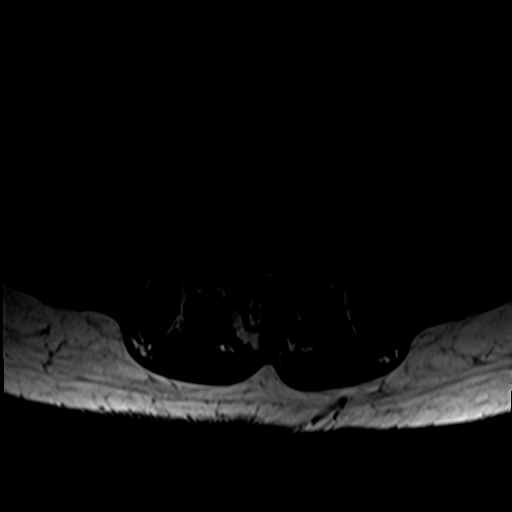
[im 13/44]
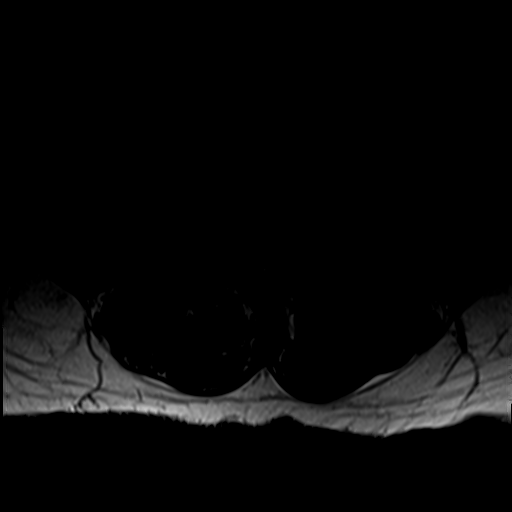
[im 19/44]
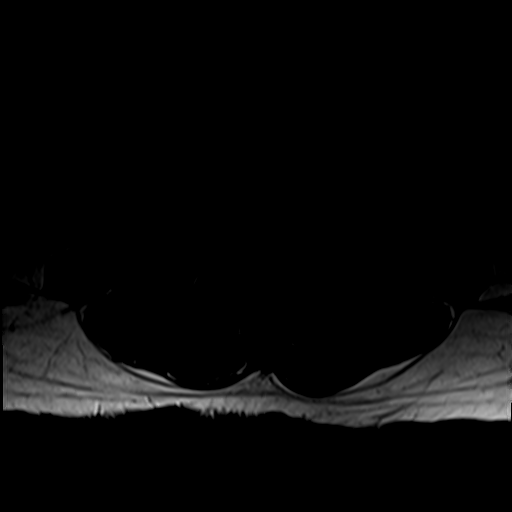
[im 22/44]
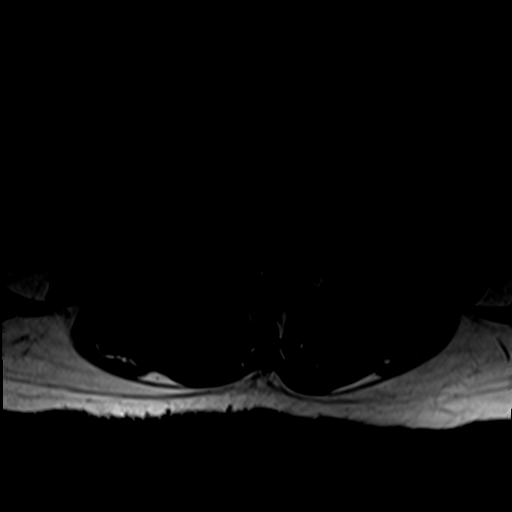
[im 37/44]
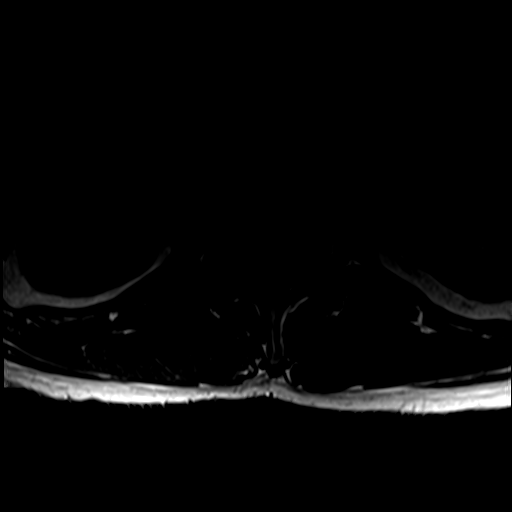

[27 of 48 positions shown; findings below may reference images not displayed]

FINDINGS: Segmentation:  Standard.

Alignment:  No significant listhesis.

Vertebrae: Vertebral body heights are maintained. No marrow edema.
No suspicious osseous lesion.

Conus medullaris and cauda equina: Conus extends to the T12 level.
Conus and cauda equina appear normal.

Paraspinal and other soft tissues: Unremarkable.

Disc levels:

L1-L2: Disc bulge slightly eccentric to the left. No canal stenosis.
Slight effacement left subarticular recess. No foraminal stenosis.

L2-L3: Disc bulge eccentric to the left with superimposed left
foraminal protrusion. Mild facet arthropathy with ligamentum flavum
infolding. Minor canal stenosis. Partial effacement of the left
subarticular recess. No right foraminal stenosis. Mild to moderate
left foraminal stenosis.

L3-L4: Disc bulge with central disc extrusion extending slightly to
the right and below disc level. Mild facet arthropathy with
ligamentum flavum infolding. Moderate canal stenosis. Partial
effacement of the subarticular recesses. Mild foraminal stenosis,
right greater than left.

L4-L5: Disc bulge with broad-based central extrusion extending just
below disc level. Moderate facet arthropathy with ligamentum flavum
infolding. Marked canal stenosis with effacement of subarticular
recesses. Mild foraminal stenosis.

L5-S1: Disc bulge. Moderate right and mild left facet arthropathy.
Minor canal stenosis. Partial effacement of the subarticular
recesses. Moderate right and mild to moderate left foraminal
stenosis.
IMPRESSION: Multilevel degenerative changes as detailed above. Canal and
subarticular recess stenosis are greatest at L4-L5. There is also
left subarticular recess narrowing at L2-L3.

## 2023-01-25 ENCOUNTER — Other Ambulatory Visit (HOSPITAL_BASED_OUTPATIENT_CLINIC_OR_DEPARTMENT_OTHER): Payer: Self-pay | Admitting: Orthopaedic Surgery

## 2023-01-25 MED ORDER — METHOCARBAMOL 500 MG PO TABS: 500.0000 mg | ORAL_TABLET | Freq: Four times a day (QID) | ORAL | 4 refills | Status: DC

## 2023-05-13 ENCOUNTER — Other Ambulatory Visit (HOSPITAL_BASED_OUTPATIENT_CLINIC_OR_DEPARTMENT_OTHER): Payer: Self-pay | Admitting: Orthopaedic Surgery

## 2023-08-19 ENCOUNTER — Other Ambulatory Visit (HOSPITAL_BASED_OUTPATIENT_CLINIC_OR_DEPARTMENT_OTHER): Payer: Self-pay | Admitting: Orthopaedic Surgery

## 2024-02-15 ENCOUNTER — Other Ambulatory Visit (HOSPITAL_BASED_OUTPATIENT_CLINIC_OR_DEPARTMENT_OTHER): Payer: Self-pay | Admitting: Orthopaedic Surgery

## 2024-02-16 ENCOUNTER — Other Ambulatory Visit (HOSPITAL_BASED_OUTPATIENT_CLINIC_OR_DEPARTMENT_OTHER): Payer: Self-pay | Admitting: Orthopaedic Surgery

## 2024-06-05 ENCOUNTER — Encounter: Payer: Self-pay | Admitting: Radiology
